# Patient Record
Sex: Male | Born: 1975
Health system: Southern US, Community
[De-identification: ages and names within clinical notes are randomized; demographics above are authoritative.]

## PROBLEM LIST (undated history)

## (undated) DIAGNOSIS — L4050 Arthropathic psoriasis, unspecified: Secondary | ICD-10-CM

## (undated) DIAGNOSIS — F419 Anxiety disorder, unspecified: Secondary | ICD-10-CM

## (undated) DIAGNOSIS — G4730 Sleep apnea, unspecified: Secondary | ICD-10-CM

## (undated) DIAGNOSIS — I10 Essential (primary) hypertension: Secondary | ICD-10-CM

## (undated) DIAGNOSIS — F32A Depression, unspecified: Secondary | ICD-10-CM

## (undated) DIAGNOSIS — T7840XA Allergy, unspecified, initial encounter: Secondary | ICD-10-CM

## (undated) DIAGNOSIS — F329 Major depressive disorder, single episode, unspecified: Secondary | ICD-10-CM

## (undated) HISTORY — DX: Depression, unspecified: F32.A

## (undated) HISTORY — PX: WISDOM TOOTH EXTRACTION: SHX21

## (undated) HISTORY — DX: Major depressive disorder, single episode, unspecified: F32.9

## (undated) HISTORY — DX: Anxiety disorder, unspecified: F41.9

## (undated) HISTORY — PX: VASECTOMY: SHX75

## (undated) HISTORY — DX: Allergy, unspecified, initial encounter: T78.40XA

## (undated) HISTORY — DX: Sleep apnea, unspecified: G47.30

## (undated) HISTORY — DX: Essential (primary) hypertension: I10

## (undated) HISTORY — DX: Arthropathic psoriasis, unspecified: L40.50

---

## 2002-05-17 HISTORY — PX: MOLE REMOVAL: SHX2046

## 2011-01-25 ENCOUNTER — Ambulatory Visit (INDEPENDENT_AMBULATORY_CARE_PROVIDER_SITE_OTHER): Payer: Managed Care, Other (non HMO) | Admitting: Family Medicine

## 2011-01-25 ENCOUNTER — Encounter: Payer: Self-pay | Admitting: Family Medicine

## 2011-01-25 DIAGNOSIS — Z8659 Personal history of other mental and behavioral disorders: Secondary | ICD-10-CM

## 2011-01-25 DIAGNOSIS — J45909 Unspecified asthma, uncomplicated: Secondary | ICD-10-CM

## 2011-01-25 DIAGNOSIS — K649 Unspecified hemorrhoids: Secondary | ICD-10-CM

## 2011-01-25 DIAGNOSIS — J4520 Mild intermittent asthma, uncomplicated: Secondary | ICD-10-CM | POA: Insufficient documentation

## 2011-01-25 MED ORDER — HYDROCORTISONE ACETATE 25 MG RE SUPP: 25.0000 mg | Freq: Two times a day (BID) | RECTAL | Status: AC

## 2011-01-25 NOTE — Patient Instructions (Signed)
Hemorrhoids Hemorrhoids are dilated (enlarged) veins around the rectum. Sometimes clots will form in the veins. This makes them swollen and painful. These are called thrombosed hemorrhoids. Causes of hemorrhoids include:  Pregnancy: this increases the pressure in the hemorrhoidal veins.   Constipation.   Straining to have a bowel movement.  HOME CARE INSTRUCTIONS  Eat a well balanced diet and drink 6 to 8 glasses of water every day to avoid constipation. You may also use a bulk laxative.   Avoid straining to have bowel movements.   Keep anal area dry and clean.   Only take over-the-counter or prescription medicines for pain, discomfort, or fever as directed by your caregiver.  If thrombosed:  Take hot sitz baths for 20 to 30 minutes, 3 to 4 times per day.   If the hemorrhoids are very tender and swollen, place ice packs on area as tolerated. Using ice packs between sitz baths may be helpful. Fill a plastic bag with ice and use a towel between the bag of ice and your skin.   Special creams and suppositories (Anusol, Nupercainal, Wyanoids) may be used or applied as directed.   Do not use a donut shaped pillow or sit on the toilet for long periods. This increases blood pooling and pain.   Move your bowels when your body has the urge; this will require less straining and will decrease pain and pressure.   Only take over-the-counter or prescription medicines for pain, discomfort, or fever as directed by your caregiver.  SEEK MEDICAL CARE IF:  You have increasing pain and swelling that is not controlled with your prescription.   You have uncontrolled bleeding.   You have an inability or difficulty having a bowel movement.   You have pain or inflammation outside the area of the hemorrhoids.   You have chills and/or an oral temperature above 100 that lasts for 2 days or longer, or as your caregiver suggests.  MAKE SURE YOU:   Understand these instructions.   Will watch your  condition.   Will get help right away if you are not doing well or get worse.  Document Released: 04/30/2000 Document Re-Released: 04/15/2008 Exit[employer] Patient Information 2011 [employer]. 

## 2011-01-25 NOTE — Progress Notes (Signed)
  Subjective:    Patient ID: Donald Pearson, male    DOB: 1976/02/19, 35 y.o.   MRN: [id number]  HPI New patient to establish care. Past medical history reviewed. Mild intermittent asthma. Uses albuterol inhaler infrequently. Past history of depression. Stopped antidepressant several months ago and stable. No prior surgeries. No current medications. Allergy to penicillin  Family history significant for mother and grandmother with type 2 diabetes. Grandparents with heart disease. Patient is married. 30-year-old daughter. Smokes one quarter pack cigarettes per day. One to 2 glasses of wine per day.  Acute issue of onset about 10 days ago bright red blood per rectum. No change in bowel habits. Occasional constipation. Bright blood. Noted with wiping. Symptoms now resolved for a few days. Similar episode 2 years ago and had anoscopy with internal hemorrhoids. Patient complains of some fatigue but no dizziness. He has scheduled lab later this week with his company for physical.   Review of Systems  Constitutional: Positive for fatigue. Negative for fever, chills, appetite change and unexpected weight change.  HENT: Negative for sore throat.   Respiratory: Negative for cough, shortness of breath and wheezing.   Cardiovascular: Negative for chest pain.  Gastrointestinal: Positive for blood in stool. Negative for nausea, vomiting, abdominal pain and diarrhea.  Genitourinary: Negative for dysuria.  Neurological: Negative for dizziness and headaches.  Hematological: Negative for adenopathy.       Objective:   Physical Exam  Constitutional: He appears well-developed and well-nourished.  HENT:  Right Ear: External ear normal.  Left Ear: External ear normal.  Mouth/Throat: Oropharynx is clear and moist.  Neck: Neck supple.  Cardiovascular: Normal rate, regular rhythm and normal heart sounds.   No murmur heard. Pulmonary/Chest: Effort normal and breath sounds normal. No respiratory distress. He  has no wheezes. He has no rales.  Abdominal: Soft. He exhibits no mass. There is no tenderness. There is no rebound and no guarding.  Genitourinary:       No external hemorrhoids. No anal fissure  Lymphadenopathy:    He has no cervical adenopathy.          Assessment & Plan:  Hematochezia. Most likely related to recurrent internal hemorrhoids. He has no bleeding past couple days. Measures to reduce constipation discussed. Hydrocortisone 25 mg suppositories twice daily. Followup promptly if any recurrent bleeding or change of bowel habits Asthma, mild intermittent. History depression.

## 2011-05-03 ENCOUNTER — Telehealth: Payer: Self-pay | Admitting: Internal Medicine

## 2011-05-03 MED ORDER — OSELTAMIVIR PHOSPHATE 75 MG PO CAPS: 75.0000 mg | ORAL_CAPSULE | Freq: Every day | ORAL | Status: AC

## 2011-05-03 NOTE — Telephone Encounter (Signed)
Wife recently diagnosed with influenza.  He has not been vaccinated.   I suggest tamiflu prophylaxis.

## 2011-05-12 ENCOUNTER — Ambulatory Visit: Payer: Managed Care, Other (non HMO) | Admitting: Family Medicine

## 2011-05-12 ENCOUNTER — Telehealth: Payer: Self-pay | Admitting: Family Medicine

## 2011-05-12 ENCOUNTER — Ambulatory Visit (INDEPENDENT_AMBULATORY_CARE_PROVIDER_SITE_OTHER): Payer: Managed Care, Other (non HMO) | Admitting: Family Medicine

## 2011-05-12 ENCOUNTER — Encounter: Payer: Self-pay | Admitting: Family Medicine

## 2011-05-12 VITALS — BP 120/84 | HR 90 | Temp 98.8°F | Wt 228.0 lb

## 2011-05-12 DIAGNOSIS — J0100 Acute maxillary sinusitis, unspecified: Secondary | ICD-10-CM

## 2011-05-12 MED ORDER — AZITHROMYCIN 250 MG PO TABS: 250.0000 mg | ORAL_TABLET | Freq: Every day | ORAL | Status: DC

## 2011-05-12 NOTE — Telephone Encounter (Signed)
Pt called to check on status of getting azithromycin (ZITHROMAX Z-PAK) 250 MG tablet sent resent to [facility] [address]

## 2011-05-12 NOTE — Telephone Encounter (Signed)
Please resubmit to [facility] - [place], [place] - [address]  azithromycin (ZITHROMAX Z-PAK) 250 MG tablet   Thank you!

## 2011-05-12 NOTE — Progress Notes (Signed)
  Subjective:    Patient ID: Donald Pearson, male    DOB: November 23, 1975, 35 y.o.   MRN: [id number]  HPI 35 year old white male, smoker, third of a pack of cigarettes per day, patient of Dr. Caryl Never is in with cough, chestcongestion, nasal congestion and one over four days and worsening. Cough is productive green sputum. Taken Afrin and Zyrtec-D and symptoms have not resolved.   Review of Systems  Constitutional: Positive for fatigue.  HENT: Positive for congestion, postnasal drip and sinus pressure.   Eyes: Negative.   Respiratory: Positive for cough.   Cardiovascular: Negative.   Gastrointestinal: Negative.   Genitourinary: Negative.   Musculoskeletal: Negative.   Skin: Negative.    Past Medical History  Diagnosis Date  . Asthma   . Depression     History   Social History  . Marital Status: Married    Spouse Name: N/A    Number of Children: N/A  . Years of Education: N/A   Occupational History  . Not on file.   Social History Main Topics  . Smoking status: Current Everyday Smoker -- 0.3 packs/day for 6 years    Types: Cigarettes  . Smokeless tobacco: Not on file  . Alcohol Use: Not on file  . Drug Use: Not on file  . Sexually Active: Not on file   Other Topics Concern  . Not on file   Social History Narrative  . No narrative on file    No past surgical history on file.  Family History  Problem Relation Age of Onset  . Diabetes Mother     type ll  . Heart disease Maternal Grandmother   . Diabetes Maternal Grandmother     type ll    Allergies  Allergen Reactions  . Penicillins     As a child, rash    Current Outpatient Prescriptions on File Prior to Visit  Medication Sig Dispense Refill  . oseltamivir (TAMIFLU) 75 MG capsule Take 1 capsule (75 mg total) by mouth daily.  10 capsule  0    BP 120/84  Pulse 90  Temp(Src) 98.8 F (37.1 C) (Oral)  Wt 228 lb (103.42 kg)chart    Objective:   Physical Exam  Constitutional: He is oriented to  person, place, and time. He appears well-developed and well-nourished.  HENT:  Head: Atraumatic.  Right Ear: External ear normal.  Eyes: Conjunctivae are normal. Pupils are equal, round, and reactive to light.  Neck: Normal range of motion. Neck supple.  Cardiovascular: Normal rate, regular rhythm and normal heart sounds.   Pulmonary/Chest: Effort normal and breath sounds normal.  Abdominal: Soft. Bowel sounds are normal.  Neurological: He is alert and oriented to person, place, and time.  Skin: Skin is warm and dry.          Assessment & Plan:  Assessment: Acute sinusitis  Plan: Z-Pak as directed. Continue Zyrtec-D. Advise to use Afrin 3 days on, 3 days off. Patient to call if symptoms worsen or persist recheck as scheduled and when necessary.

## 2011-05-12 NOTE — Telephone Encounter (Signed)
Rx called in to pharmacy. 

## 2011-05-12 NOTE — Patient Instructions (Signed)

## 2011-07-15 ENCOUNTER — Encounter: Payer: Self-pay | Admitting: Family Medicine

## 2011-07-15 ENCOUNTER — Ambulatory Visit (INDEPENDENT_AMBULATORY_CARE_PROVIDER_SITE_OTHER): Payer: Managed Care, Other (non HMO) | Admitting: Family Medicine

## 2011-07-15 VITALS — BP 130/80 | Temp 98.1°F | Wt 229.0 lb

## 2011-07-15 DIAGNOSIS — F4010 Social phobia, unspecified: Secondary | ICD-10-CM

## 2011-07-15 DIAGNOSIS — Z8659 Personal history of other mental and behavioral disorders: Secondary | ICD-10-CM

## 2011-07-15 DIAGNOSIS — J329 Chronic sinusitis, unspecified: Secondary | ICD-10-CM

## 2011-07-15 MED ORDER — AZITHROMYCIN 250 MG PO TABS: 250.0000 mg | ORAL_TABLET | Freq: Every day | ORAL | Status: DC

## 2011-07-15 MED ORDER — FLUOXETINE HCL 20 MG PO TABS: 20.0000 mg | ORAL_TABLET | Freq: Every day | ORAL | Status: DC

## 2011-07-15 NOTE — Patient Instructions (Signed)

## 2011-07-15 NOTE — Progress Notes (Signed)
  Subjective:    Patient ID: Donald Pearson, male    DOB: 05/23/1975, 36 y.o.   MRN: [id number]  HPI  Onset over one week of sinus symptoms. Facial pain and nasal congestion which is yellow appearance. Had some postnasal drainage. Moderate cough is nonproductive. Using Zyrtec-D without much relief. Intermittent headaches. Mild sore throat. Minimal body aches. Occasional nausea but no vomiting. He has tendency to get sinusitis a few times per year.  Patient relates prior history of depression and social anxiety disorder. Having more anxiety symptoms and depression this time. Has been previous on Prozac and requesting getting back on this. He tolerated well previously. He has anxiety in multiple settings but mostly in public. No history of panic disorder.  Past Medical History  Diagnosis Date  . Asthma   . Depression    No past surgical history on file.  reports that he has been smoking Cigarettes.  He has a 1.8 pack-year smoking history. He does not have any smokeless tobacco history on file. His alcohol and drug histories not on file. family history includes Diabetes in his maternal grandmother and mother and Heart disease in his maternal grandmother. Allergies  Allergen Reactions  . Penicillins     As a child, rash      Review of Systems  HENT: Positive for congestion and sinus pressure.   Respiratory: Positive for cough. Negative for shortness of breath.   Neurological: Positive for headaches.  Psychiatric/Behavioral: Positive for dysphoric mood. Negative for confusion and agitation. The patient is nervous/anxious.        Objective:   Physical Exam  Constitutional: He is oriented to person, place, and time. He appears well-developed and well-nourished.  HENT:  Right Ear: External ear normal.  Left Ear: External ear normal.  Mouth/Throat: Oropharynx is clear and moist.  Neck: Neck supple. No thyromegaly present.  Cardiovascular: Normal rate and regular rhythm.     Pulmonary/Chest: Effort normal and breath sounds normal. No respiratory distress. He has no wheezes. He has no rales.  Lymphadenopathy:    He has no cervical adenopathy.  Neurological: He is alert and oriented to person, place, and time.  Psychiatric: He has a normal mood and affect. His behavior is normal. Thought content normal.          Assessment & Plan:  #1 acute sinusitis. Probably viral. Recommend observation or now. Written prescription for Zithromax to fill only if he is not noticing improvement over the next few days or start sooner for any worsening #2 anxiety. Probable social anxiety disorder. Start back fluoxetine 20 mg by mouth daily

## 2011-07-30 ENCOUNTER — Ambulatory Visit (INDEPENDENT_AMBULATORY_CARE_PROVIDER_SITE_OTHER): Payer: Managed Care, Other (non HMO) | Admitting: Family Medicine

## 2011-07-30 ENCOUNTER — Encounter: Payer: Self-pay | Admitting: Family Medicine

## 2011-07-30 VITALS — BP 130/90 | Temp 98.1°F | Wt 229.0 lb

## 2011-07-30 DIAGNOSIS — J329 Chronic sinusitis, unspecified: Secondary | ICD-10-CM

## 2011-07-30 DIAGNOSIS — R1110 Vomiting, unspecified: Secondary | ICD-10-CM

## 2011-07-30 DIAGNOSIS — R5381 Other malaise: Secondary | ICD-10-CM

## 2011-07-30 DIAGNOSIS — R5383 Other fatigue: Secondary | ICD-10-CM

## 2011-07-30 MED ORDER — LEVOFLOXACIN 500 MG PO TABS: 500.0000 mg | ORAL_TABLET | Freq: Every day | ORAL | Status: AC

## 2011-07-30 NOTE — Progress Notes (Addendum)
Subjective:    Patient ID: Donald Pearson, male    DOB: 09/13/1975, 36 y.o.   MRN: [id number]  HPI  Patient seen for the following issues  Recurrent sinus problems. Has had about 3 weeks of sinus pressure mostly right maxillary sinus region. Was seen about 2 weeks ago and treated with Zithromax and did feel better briefly. He had several days of some progressive right maxillary pain. Intermittent headache. Fatigue. Occasional sore throat. Occasional bloody nasal discharge with yellow thick mucus. Using Afrin nasal without much improvement.  Patient had about 4-5 episodes of spontaneous vomiting over the past couple months. No clear pattern though possibly related to heavy fatty foods. He denies any abdominal pain. Denies any associated headache, fever, concomitant diarrhea, or any other clear precipitating factors. Denies any dysphagia. No concomitant headache. Only medication is fluoxetine. He has occasional alternating diarrhea and constipation but is usually relatively mild.  NO appetite or weight changes.  Past Medical History  Diagnosis Date  . Asthma   . Depression    No past surgical history on file.  reports that he has been smoking Cigarettes.  He has a 1.8 pack-year smoking history. He does not have any smokeless tobacco history on file. His alcohol and drug histories not on file. family history includes Diabetes in his maternal grandmother and mother and Heart disease in his maternal grandmother. Allergies  Allergen Reactions  . Penicillins     As a child, rash      Review of Systems  Constitutional: Positive for fatigue. Negative for fever, chills and appetite change.  HENT: Negative for hearing loss, ear pain, sore throat, trouble swallowing and neck stiffness.   Eyes: Negative for visual disturbance.  Respiratory: Negative for shortness of breath and wheezing.   Cardiovascular: Negative for chest pain and leg swelling.  Gastrointestinal: Positive for vomiting,  diarrhea and constipation. Negative for abdominal pain and blood in stool.  Neurological: Positive for headaches. Negative for dizziness, seizures, syncope and weakness.  Hematological: Negative for adenopathy.       Objective:   Physical Exam  Constitutional: He is oriented to person, place, and time. He appears well-developed and well-nourished. No distress.  HENT:  Right Ear: External ear normal.  Left Ear: External ear normal.       Swollen nasal mucosa bilaterally. Erythematous nasal mucosa. Yellow mucous right naris  Neck: Neck supple. No thyromegaly present.  Cardiovascular: Normal rate and regular rhythm.   Pulmonary/Chest: Effort normal and breath sounds normal. No respiratory distress. He has no wheezes. He has no rales.  Abdominal: Soft. Bowel sounds are normal. He exhibits no distension and no mass. There is no tenderness. There is no rebound and no guarding.  Musculoskeletal: He exhibits no edema.  Lymphadenopathy:    He has no cervical adenopathy.  Neurological: He is alert and oriented to person, place, and time. No cranial nerve deficit.  Skin: No rash noted.          Assessment & Plan:  #1 recurrent versus chronic maxillary sinusitis. Patient did improve briefly with initial antibiotic. Levaquin 500 milligrams once daily for 10 days. CT maxillofacial if not improved with above #2 recurrent vomiting over 2 months. Total of about 4 episodes. Doubt related to #1 as vomiting preceded above symptoms. No clear pattern or associated symptoms. Start with basic lab work. Consider abdominal ultrasound as symptoms may be correlated with high fat foods. Is not associated with any abdomnal pain.  Labs OK. Schedule abdominal ultrasound and if normal  and vomiting persist will need GI referral.

## 2011-07-31 LAB — CBC WITH DIFFERENTIAL/PLATELET
Basophils Relative: 1 % (ref 0–1)
Hemoglobin: 16.6 g/dL (ref 13.0–17.0)
MCHC: 35.5 g/dL (ref 30.0–36.0)
Monocytes Relative: 9 % (ref 3–12)
Neutro Abs: 5.4 10*3/uL (ref 1.7–7.7)
Neutrophils Relative %: 56 % (ref 43–77)
Platelets: 218 10*3/uL (ref 150–400)
RBC: 5.19 MIL/uL (ref 4.22–5.81)

## 2011-07-31 LAB — BASIC METABOLIC PANEL
Calcium: 10.3 mg/dL (ref 8.4–10.5)
Creat: 1.13 mg/dL (ref 0.50–1.35)

## 2011-07-31 LAB — LIPASE: Lipase: 32 U/L (ref 0–75)

## 2011-07-31 LAB — HEPATIC FUNCTION PANEL
AST: 28 U/L (ref 0–37)
Bilirubin, Direct: <0.1 mg/dL (ref 0.0–0.3)
Total Bilirubin: 0.7 mg/dL (ref 0.3–1.2)

## 2011-07-31 LAB — TSH: TSH: 1.127 u[IU]/mL (ref 0.350–4.500)

## 2011-08-01 NOTE — Progress Notes (Signed)
Addended by: Kristian Covey on: 08/01/2011 05:36 PM   Modules accepted: Orders

## 2011-08-05 ENCOUNTER — Other Ambulatory Visit: Payer: Managed Care, Other (non HMO)

## 2011-10-19 ENCOUNTER — Ambulatory Visit (INDEPENDENT_AMBULATORY_CARE_PROVIDER_SITE_OTHER): Payer: Managed Care, Other (non HMO) | Admitting: Family Medicine

## 2011-10-19 ENCOUNTER — Encounter: Payer: Self-pay | Admitting: Family Medicine

## 2011-10-19 VITALS — BP 120/70 | Temp 98.8°F | Wt 225.0 lb

## 2011-10-19 DIAGNOSIS — J019 Acute sinusitis, unspecified: Secondary | ICD-10-CM

## 2011-10-19 MED ORDER — AZITHROMYCIN 250 MG PO TABS: ORAL_TABLET | ORAL | Status: DC

## 2011-10-19 NOTE — Progress Notes (Signed)
  Subjective:    Patient ID: Donald Pearson, male    DOB: 1976-05-05, 36 y.o.   MRN: [id number]  HPI  Patient presents with one week history of progressive sinus issues. Has some seasonal allergies takes Zyrtec-D regularly. Intermittent headaches and bilateral maxillary facial pain. Yellowish nasal discharge. No fever. Cough is mostly dry. Increased malaise. He's also tried nasal saline irrigation without improvement.   Review of Systems  Constitutional: Negative for fever and chills.  HENT: Positive for congestion and sinus pressure.   Respiratory: Positive for cough. Negative for shortness of breath and wheezing.   Neurological: Positive for headaches.       Objective:   Physical Exam  Constitutional: He appears well-developed and well-nourished.  HENT:  Right Ear: External ear normal.  Left Ear: External ear normal.  Mouth/Throat: Oropharynx is clear and moist.       Slightly erythematous nasal mucosa bilaterally otherwise normal  Neck: Neck supple.  Cardiovascular: Normal rate and regular rhythm.   Pulmonary/Chest: Effort normal and breath sounds normal. No respiratory distress. He has no wheezes. He has no rales.  Lymphadenopathy:    He has no cervical adenopathy.          Assessment & Plan:   acute sinusitis. Continue saline nasal irrigation. Zithromax for 5 days. Continue Zyrtec-D

## 2012-05-29 ENCOUNTER — Ambulatory Visit (INDEPENDENT_AMBULATORY_CARE_PROVIDER_SITE_OTHER): Payer: Managed Care, Other (non HMO) | Admitting: Family Medicine

## 2012-05-29 ENCOUNTER — Encounter: Payer: Self-pay | Admitting: Family Medicine

## 2012-05-29 VITALS — BP 130/90 | Temp 99.5°F | Wt 239.0 lb

## 2012-05-29 DIAGNOSIS — J45909 Unspecified asthma, uncomplicated: Secondary | ICD-10-CM

## 2012-05-29 MED ORDER — BECLOMETHASONE DIPROPIONATE 80 MCG/ACT IN AERS: 2.0000 | INHALATION_SPRAY | Freq: Two times a day (BID) | RESPIRATORY_TRACT | Status: DC

## 2012-05-29 MED ORDER — ALBUTEROL SULFATE HFA 108 (90 BASE) MCG/ACT IN AERS: 2.0000 | INHALATION_SPRAY | RESPIRATORY_TRACT | Status: DC | PRN

## 2012-05-29 NOTE — Progress Notes (Signed)
  Subjective:    Patient ID: Donald Pearson, male    DOB: 12/24/1975, 37 y.o.   MRN: [id number]  HPI History of asthma. This has been generally mild intermittent. He got a new dog in October. Since mid December has been using his Ventolin inhaler almost daily. Frequent coughing and wheezing. Symptoms come and go. Takes Zyrtec intermittently. In the past has been on steroid inhaler but not for some time.    Review of Systems  Constitutional: Negative for fever and chills.  Respiratory: Positive for cough and wheezing.   Cardiovascular: Negative for chest pain.       Objective:   Physical Exam  Constitutional: He appears well-developed and well-nourished.  HENT:  Right Ear: External ear normal.  Left Ear: External ear normal.  Mouth/Throat: Oropharynx is clear and moist.  Cardiovascular: Normal rate and regular rhythm.   Pulmonary/Chest: Effort normal and breath sounds normal. No respiratory distress. He has no wheezes. He has no rales.          Assessment & Plan:  Asthma. This is becoming more persistent (moderate persistent). Start Qvar 80 2 puffs twice daily with samples given. Continue Ventolin as needed. Use Albuterol-as rescue inhaler. Instructions given for steroid inhaler. Flu vaccine offered and he will consider getting through work

## 2012-05-29 NOTE — Patient Instructions (Addendum)

## 2012-07-24 ENCOUNTER — Other Ambulatory Visit: Payer: Self-pay | Admitting: Family Medicine

## 2013-02-10 ENCOUNTER — Other Ambulatory Visit: Payer: Self-pay | Admitting: Family Medicine

## 2013-04-06 ENCOUNTER — Encounter: Payer: Self-pay | Admitting: Family Medicine

## 2013-04-06 ENCOUNTER — Ambulatory Visit (INDEPENDENT_AMBULATORY_CARE_PROVIDER_SITE_OTHER): Payer: Managed Care, Other (non HMO) | Admitting: Family Medicine

## 2013-04-06 VITALS — BP 128/68 | HR 105 | Temp 97.7°F | Wt 232.0 lb

## 2013-04-06 DIAGNOSIS — J069 Acute upper respiratory infection, unspecified: Secondary | ICD-10-CM

## 2013-04-06 MED ORDER — FLUTICASONE PROPIONATE 50 MCG/ACT NA SUSP: 2.0000 | Freq: Every day | NASAL | Status: DC

## 2013-04-06 NOTE — Patient Instructions (Signed)
Acute Bronchitis Bronchitis is inflammation of the airways that extend from the windpipe into the lungs (bronchi). The inflammation often causes mucus to develop. This leads to a cough, which is the most common symptom of bronchitis.  In acute bronchitis, the condition usually develops suddenly and goes away over time, usually in a couple weeks. Smoking, allergies, and asthma can make bronchitis worse. Repeated episodes of bronchitis may cause further lung problems.  CAUSES Acute bronchitis is most often caused by the same virus that causes a cold. The virus can spread from person to person (contagious).  SIGNS AND SYMPTOMS   Cough.   Fever.   Coughing up mucus.   Body aches.   Chest congestion.   Chills.   Shortness of breath.   Sore throat.  DIAGNOSIS  Acute bronchitis is usually diagnosed through a physical exam. Tests, such as chest X-rays, are sometimes done to rule out other conditions.  TREATMENT  Acute bronchitis usually goes away in a couple weeks. Often times, no medical treatment is necessary. Medicines are sometimes given for relief of fever or cough. Antibiotics are usually not needed but may be prescribed in certain situations. In some cases, an inhaler may be recommended to help reduce shortness of breath and control the cough. A cool mist vaporizer may also be used to help thin bronchial secretions and make it easier to clear the chest.  HOME CARE INSTRUCTIONS  Get plenty of rest.   Drink enough fluids to keep your urine clear or pale yellow (unless you have a medical condition that requires fluid restriction). Increasing fluids may help thin your secretions and will prevent dehydration.   Only take over-the-counter or prescription medicines as directed by your health care provider.   Avoid smoking and secondhand smoke. Exposure to cigarette smoke or irritating chemicals will make bronchitis worse. If you are a smoker, consider using nicotine gum or skin  patches to help control withdrawal symptoms. Quitting smoking will help your lungs heal faster.   Reduce the chances of another bout of acute bronchitis by washing your hands frequently, avoiding people with cold symptoms, and trying not to touch your hands to your mouth, nose, or eyes.   Follow up with your health care provider as directed.  SEEK MEDICAL CARE IF: Your symptoms do not improve after 1 week of treatment.  SEEK IMMEDIATE MEDICAL CARE IF:  You develop an increased fever or chills.   You have chest pain.   You have severe shortness of breath.  You have bloody sputum.   You develop dehydration.  You develop fainting.  You develop repeated vomiting.  You develop a severe headache. MAKE SURE YOU:   Understand these instructions.  Will watch your condition.  Will get help right away if you are not doing well or get worse. Document Released: 06/10/2004 Document Revised: 01/03/2013 Document Reviewed: 10/24/2012 [employer] Patient Information 2014 [employer].  

## 2013-04-06 NOTE — Progress Notes (Signed)
Pre visit review using our clinic review tool, if applicable. No additional management support is needed unless otherwise documented below in the visit note. 

## 2013-04-06 NOTE — Progress Notes (Signed)
  Subjective:    Patient ID: Donald Pearson, male    DOB: [birth year], 37 y.o.   MRN: [id number]  HPI  Acute visit Onset last Saturday of cough, nasal congestion, and sore throat. Denies any fever. Seemed to be getting better last Wednesday and yesterday felt worse again. Still no fever. Mostly has some postnasal drainage and cough. He prefers not to take any cough suppressants. Has tried over-the-counter Zyrtec-D. Nasal stuffiness especially worse in the night. Minimal headache.  Past Medical History  Diagnosis Date  . Asthma   . Depression    No past surgical history on file.  reports that he has been smoking Cigarettes.  He has a 1.8 pack-year smoking history. He does not have any smokeless tobacco history on file. His alcohol and drug histories are not on file. family history includes Diabetes in his maternal grandmother and mother; Heart disease in his maternal grandmother. Allergies  Allergen Reactions  . Penicillins     As a child, rash     Review of Systems  Constitutional: Negative for fever, chills and fatigue.  HENT: Positive for congestion and sore throat.   Respiratory: Positive for cough.        Objective:   Physical Exam  Constitutional: He appears well-developed and well-nourished.  HENT:  Right Ear: External ear normal.  Left Ear: External ear normal.  Mouth/Throat: Oropharynx is clear and moist.  Neck: Neck supple.  Cardiovascular: Normal rate and regular rhythm.   Pulmonary/Chest: Effort normal and breath sounds normal. No respiratory distress. He has no wheezes. He has no rales.  Lymphadenopathy:    He has no cervical adenopathy.          Assessment & Plan:  Viral URI with cough. Nonfocal exam. Flonase nasal 2 sprays per nostril once daily and continue Zyrtec-D. Followup promptly for fever or worsening symptoms

## 2013-06-11 ENCOUNTER — Other Ambulatory Visit: Payer: Self-pay | Admitting: Family Medicine

## 2013-08-02 ENCOUNTER — Ambulatory Visit: Payer: Managed Care, Other (non HMO) | Admitting: Family Medicine

## 2013-08-02 DIAGNOSIS — Z0289 Encounter for other administrative examinations: Secondary | ICD-10-CM

## 2013-08-07 ENCOUNTER — Other Ambulatory Visit: Payer: Self-pay

## 2013-08-07 ENCOUNTER — Telehealth: Payer: Self-pay | Admitting: Family Medicine

## 2013-08-07 MED ORDER — ALBUTEROL SULFATE HFA 108 (90 BASE) MCG/ACT IN AERS: 2.0000 | INHALATION_SPRAY | RESPIRATORY_TRACT | Status: DC | PRN

## 2013-08-07 NOTE — Telephone Encounter (Signed)
RX sent to pharmacy  

## 2013-08-07 NOTE — Telephone Encounter (Signed)
[facility] - [place], [place] - [address] is requesting re-fill on albuterol (PROVENTIL HFA;VENTOLIN HFA) 108 (90 BASE) MCG/ACT inhaler

## 2014-02-01 ENCOUNTER — Other Ambulatory Visit: Payer: Self-pay | Admitting: Family Medicine

## 2014-05-24 ENCOUNTER — Other Ambulatory Visit: Payer: Self-pay | Admitting: Family Medicine

## 2014-05-31 ENCOUNTER — Other Ambulatory Visit: Payer: Self-pay | Admitting: Family Medicine

## 2014-06-06 ENCOUNTER — Encounter: Payer: Self-pay | Admitting: Family Medicine

## 2014-06-06 ENCOUNTER — Ambulatory Visit (INDEPENDENT_AMBULATORY_CARE_PROVIDER_SITE_OTHER): Payer: Managed Care, Other (non HMO) | Admitting: Family Medicine

## 2014-06-06 VITALS — BP 130/76 | HR 110 | Temp 98.6°F | Wt 243.0 lb

## 2014-06-06 DIAGNOSIS — F4010 Social phobia, unspecified: Secondary | ICD-10-CM

## 2014-06-06 MED ORDER — FLUOXETINE HCL 20 MG PO TABS: 20.0000 mg | ORAL_TABLET | Freq: Every day | ORAL | Status: DC

## 2014-06-06 NOTE — Progress Notes (Signed)
Pre visit review using our clinic review tool, if applicable. No additional management support is needed unless otherwise documented below in the visit note. 

## 2014-06-06 NOTE — Progress Notes (Signed)
   Subjective:    Patient ID: Donald Pearson, male    DOB: [birth year], 39 y.o.   MRN: [id number]  HPI Patient seen for medical follow-up. He has history of mild intermittent asthma and rarely wheezes. He takes only as needed albuterol. He has history of social anxiety disorder has been on Prozac 20 mg daily for several years. Symptoms are mostly well controlled still has breakthrough symptoms occasionally. He's had depression the past which is stable. 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 Asthma   . Depression    No past surgical history on file.  reports that he has been smoking Cigarettes.  He has a 1.8 pack-year smoking history. He does not have any smokeless tobacco history on file. His alcohol and drug histories are not on file. family history includes Diabetes in his maternal grandmother and mother; Heart disease in his maternal grandmother. Allergies  Allergen Reactions  . Penicillins     As a child, rash      Review of Systems  Constitutional: Negative for appetite change and unexpected weight change.  Respiratory: Negative for cough and shortness of breath.   Cardiovascular: Negative for chest pain.  Psychiatric/Behavioral: Negative for suicidal ideas, sleep disturbance, dysphoric mood and agitation. The patient is nervous/anxious.        Objective:   Physical Exam  Constitutional: He appears well-developed and well-nourished.  Neck: Neck supple. No thyromegaly present.  Cardiovascular: Normal rate and regular rhythm.  Exam reveals no gallop.   No murmur heard. Pulmonary/Chest: Effort normal and breath sounds normal. No respiratory distress. He has no wheezes. He has no rales.  Musculoskeletal: He exhibits no edema.  Psychiatric: He has a normal mood and affect. His behavior is normal.          Assessment & Plan:  Social anxiety disorder. Currently stable. Refill Prozac for one year. We  discussed that we could titrate this medication up if he has any issues with poor control down the road. He will be in touch and let us know.

## 2014-06-10 ENCOUNTER — Ambulatory Visit: Payer: Managed Care, Other (non HMO) | Admitting: Family Medicine

## 2014-06-17 ENCOUNTER — Ambulatory Visit (INDEPENDENT_AMBULATORY_CARE_PROVIDER_SITE_OTHER): Payer: Managed Care, Other (non HMO) | Admitting: Family Medicine

## 2014-06-17 DIAGNOSIS — Z23 Encounter for immunization: Secondary | ICD-10-CM

## 2014-07-08 ENCOUNTER — Encounter: Payer: Self-pay | Admitting: Family Medicine

## 2014-07-08 ENCOUNTER — Ambulatory Visit (INDEPENDENT_AMBULATORY_CARE_PROVIDER_SITE_OTHER): Payer: Managed Care, Other (non HMO) | Admitting: Family Medicine

## 2014-07-08 DIAGNOSIS — R6889 Other general symptoms and signs: Secondary | ICD-10-CM

## 2014-07-08 DIAGNOSIS — J029 Acute pharyngitis, unspecified: Secondary | ICD-10-CM

## 2014-07-08 LAB — POCT RAPID STREP A (OFFICE): RAPID STREP A SCREEN: NEGATIVE

## 2014-07-08 LAB — POCT INFLUENZA A/B
INFLUENZA A, POC: NEGATIVE
Influenza B, POC: NEGATIVE

## 2014-07-08 MED ORDER — AZITHROMYCIN 250 MG PO TABS: ORAL_TABLET | ORAL | Status: AC

## 2014-07-08 MED ORDER — ALBUTEROL SULFATE HFA 108 (90 BASE) MCG/ACT IN AERS: 2.0000 | INHALATION_SPRAY | RESPIRATORY_TRACT | Status: DC | PRN

## 2014-07-08 NOTE — Progress Notes (Signed)
Pre visit review using our clinic review tool, if applicable. No additional management support is needed unless otherwise documented below in the visit note. 

## 2014-07-08 NOTE — Progress Notes (Signed)
   Subjective:    Patient ID: Donald Pearson, male    DOB: Sep 23, 1975, 39 y.o.   MRN: [id number]  HPI Acute visit. Patient states about 10 days ago he had typical cold-like symptoms. He seen to be improving and then this past weekend had relapse. He developed recurrent cough, nasal congestion, sore throat, and some body aches. Had some frontal and maxillary sinus pressure bilaterally. Headaches. Fever onset today with some chills. He had flu vaccine earlier this year. He has a newborn son at home. No obvious sick contacts. Denies any skin rash. No nausea or vomiting.  Past Medical History  Diagnosis Date  . Asthma   . Depression    No past surgical history on file.  reports that he has been smoking Cigarettes.  He has a 1.8 pack-year smoking history. He does not have any smokeless tobacco history on file. His alcohol and drug histories are not on file. family history includes Diabetes in his maternal grandmother and mother; Heart disease in his maternal grandmother. Allergies  Allergen Reactions  . Penicillins     As a child, rash      Review of Systems  Constitutional: Positive for fever, chills and fatigue.  HENT: Positive for congestion and sinus pressure.   Respiratory: Positive for cough.   Gastrointestinal: Negative for nausea and vomiting.  Neurological: Positive for headaches.       Objective:   Physical Exam  Constitutional: He appears well-developed and well-nourished. No distress.  HENT:  Right Ear: External ear normal.  Left Ear: External ear normal.  Mild erythema posterior pharynx but no exudate  Neck: Neck supple.  Cardiovascular: Normal rate and regular rhythm.   Pulmonary/Chest: Effort normal and breath sounds normal. No respiratory distress. He has no wheezes. He has no rales.  Lymphadenopathy:    He has no cervical adenopathy.  Skin: No rash noted.          Assessment & Plan:  Febrile illness. Suspect viral. Rule out influenza. Rapid strep  negative.  Influenza screen negative. Even though we suspect this started as a virus he has had relapse and with new onset fever and sinusitis symptoms cover with Zithromax. He is allergic to penicillin. We've highly advised that he take measures to reduce risk of contagiousness to his newborn son and if possible not to be around him for the next several days.

## 2015-06-27 ENCOUNTER — Other Ambulatory Visit: Payer: Self-pay | Admitting: Family Medicine

## 2015-09-04 ENCOUNTER — Other Ambulatory Visit: Payer: Self-pay | Admitting: Family Medicine

## 2015-10-03 ENCOUNTER — Other Ambulatory Visit: Payer: Self-pay | Admitting: Family Medicine

## 2016-03-25 ENCOUNTER — Other Ambulatory Visit: Payer: Self-pay | Admitting: Family Medicine

## 2016-03-25 MED ORDER — ALBUTEROL SULFATE HFA 108 (90 BASE) MCG/ACT IN AERS: 2.0000 | INHALATION_SPRAY | RESPIRATORY_TRACT | 0 refills | Status: DC | PRN

## 2016-03-25 NOTE — Telephone Encounter (Signed)
° ° °  Pt request refill of the following:  albuterol (PROVENTIL HFA;VENTOLIN HFA) 108 (90 BASE) MCG/ACT inhaler   [facility]  [facility] [address]

## 2016-03-25 NOTE — Telephone Encounter (Signed)
Rx done. 

## 2016-04-12 ENCOUNTER — Other Ambulatory Visit: Payer: Self-pay | Admitting: Family Medicine

## 2016-04-29 ENCOUNTER — Other Ambulatory Visit: Payer: Self-pay | Admitting: Family Medicine

## 2016-04-30 NOTE — Telephone Encounter (Signed)
Pt would  like to refill albuterol (PROVENTIL HFA;VENTOLIN HFA) 108 (90 Base) MCG/ACT inhaler  FLUoxetine (PROZAC) 20 MG tablet  [facility] [address]

## 2016-04-30 NOTE — Telephone Encounter (Signed)
Pt has been scheduled.  °

## 2016-04-30 NOTE — Telephone Encounter (Signed)
Pt needs a follow up appt--once appt scheduled will refill until then.

## 2016-04-30 NOTE — Telephone Encounter (Signed)
Left message to call back and schedule follow up appts,.

## 2016-05-03 ENCOUNTER — Ambulatory Visit (INDEPENDENT_AMBULATORY_CARE_PROVIDER_SITE_OTHER): Payer: Managed Care, Other (non HMO) | Admitting: Family Medicine

## 2016-05-03 ENCOUNTER — Encounter: Payer: Self-pay | Admitting: Family Medicine

## 2016-05-03 VITALS — BP 124/90 | HR 123 | Temp 98.3°F | Ht 68.25 in | Wt 227.0 lb

## 2016-05-03 DIAGNOSIS — J019 Acute sinusitis, unspecified: Secondary | ICD-10-CM

## 2016-05-03 DIAGNOSIS — Z716 Tobacco abuse counseling: Secondary | ICD-10-CM

## 2016-05-03 DIAGNOSIS — J4520 Mild intermittent asthma, uncomplicated: Secondary | ICD-10-CM

## 2016-05-03 DIAGNOSIS — Z720 Tobacco use: Secondary | ICD-10-CM

## 2016-05-03 DIAGNOSIS — F4010 Social phobia, unspecified: Secondary | ICD-10-CM | POA: Diagnosis not present

## 2016-05-03 MED ORDER — ALBUTEROL SULFATE HFA 108 (90 BASE) MCG/ACT IN AERS: 2.0000 | INHALATION_SPRAY | RESPIRATORY_TRACT | 0 refills | Status: DC | PRN

## 2016-05-03 MED ORDER — VARENICLINE TARTRATE 0.5 MG X 11 & 1 MG X 42 PO MISC: ORAL | 0 refills | Status: DC

## 2016-05-03 MED ORDER — DOXYCYCLINE HYCLATE 100 MG PO CAPS: 100.0000 mg | ORAL_CAPSULE | Freq: Two times a day (BID) | ORAL | 0 refills | Status: DC

## 2016-05-03 MED ORDER — VARENICLINE TARTRATE 1 MG PO TABS: 1.0000 mg | ORAL_TABLET | Freq: Two times a day (BID) | ORAL | 1 refills | Status: DC

## 2016-05-03 NOTE — Progress Notes (Signed)
Subjective:     Patient ID: Donald Pearson, male   DOB: 1975/06/09, 40 y.o.   MRN: [id number]  HPI Patient here to discuss multiple issues as follows  He relates sinusitis symptoms for almost 2 weeks. Started with somewhat typical cold-like symptoms but symptoms have worsened past few days. He has nasal congestion which is worse at night, cough, occasional headaches, yellowish nasal discharge, and maxillary facial pain. No fevers or chills. No relief with over-the-counter medications  History of mild intermittent asthma. Requesting refill of albuterol which he uses infrequently. Generally only wheezes with upper respiratory infections  Long history of social anxiety disorder. He takes Prozac 20 mg once daily and this seems to be controlling his anxiety symptoms very well. Decreased refills. He had some mild depression of the past but has been very stable  Ongoing nicotine use. Currently smokes less than 1 pack per day. He's tried over-the-counter gum without much success. He specifically has interest in trying Chantix. He denies any active depression symptoms. He has an almost 38-year-old son at home and this is increasing his motivation to quit  Past Medical History:  Diagnosis Date  . Asthma   . Depression    History reviewed. No pertinent surgical history.  reports that he has been smoking Cigarettes.  He has a 1.80 pack-year smoking history. He has never used smokeless tobacco. He reports that he drinks alcohol. He reports that he does not use drugs. family history includes Diabetes in his maternal grandmother and mother; Heart disease in his maternal grandmother. Allergies  Allergen Reactions  . Penicillins     As a child, rash     Review of Systems  Constitutional: Positive for fatigue. Negative for chills and fever.  HENT: Positive for congestion, sinus pain and sinus pressure. Negative for sore throat.   Respiratory: Positive for cough. Negative for shortness of breath.    Cardiovascular: Negative for chest pain.  Neurological: Positive for headaches.  Psychiatric/Behavioral: Negative for agitation, sleep disturbance and suicidal ideas.       Objective:   Physical Exam  Constitutional: He appears well-developed and well-nourished.  HENT:  Right Ear: External ear normal.  Left Ear: External ear normal.  Mouth/Throat: Oropharynx is clear and moist.  Neck: Neck supple.  Cardiovascular: Normal rate and regular rhythm.   Pulmonary/Chest: Effort normal and breath sounds normal. No respiratory distress. He has no wheezes. He has no rales.  Lymphadenopathy:    He has no cervical adenopathy.  Psychiatric: He has a normal mood and affect. His behavior is normal. Judgment and thought content normal.       Assessment:     #1 probable acute maxillary sinusitis  #2 social anxiety disorder stable on Prozac  #3 ongoing nicotine use with current high motivation to quit  #4 mild intermittent asthma    Plan:     -Doxycycline 100 mg twice daily for 10 days -Refill albuterol for as needed use -Continue Prozac 20 mg daily for social anxiety disorder -Long discussion regarding smoking cessation. We discussed pros and cons of using Chantix. Even though he has had some anxiety symptoms in the past he's never tried Chantix and we do not see any contraindications. Prescription for starter pack and maintenance pack given  Kristian CoveyBruce W Advith Martine MD [facility]

## 2016-05-03 NOTE — Progress Notes (Signed)
Pre visit review using our clinic review tool, if applicable. No additional management support is needed unless otherwise documented below in the visit note. 

## 2016-05-03 NOTE — Patient Instructions (Signed)
Steps to Quit Smoking Smoking tobacco can be harmful to your health and can affect almost every organ in your body. Smoking puts you, and those around you, at risk for developing many serious chronic diseases. Quitting smoking is difficult, but it is one of the best things that you can do for your health. It is never too late to quit. What are the benefits of quitting smoking? When you quit smoking, you lower your risk of developing serious diseases and conditions, such as:  Lung cancer or lung disease, such as COPD.  Heart disease.  Stroke.  Heart attack.  Infertility.  Osteoporosis and bone fractures.  Additionally, symptoms such as coughing, wheezing, and shortness of breath may get better when you quit. You may also find that you get sick less often because your body is stronger at fighting off colds and infections. If you are pregnant, quitting smoking can help to reduce your chances of having a baby of low birth weight. How do I get ready to quit? When you decide to quit smoking, create a plan to make sure that you are successful. Before you quit:  Pick a date to quit. Set a date within the next two weeks to give you time to prepare.  Write down the reasons why you are quitting. Keep this list in places where you will see it often, such as on your bathroom mirror or in your car or wallet.  Identify the people, places, things, and activities that make you want to smoke (triggers) and avoid them. Make sure to take these actions: ? Throw away all cigarettes at home, at work, and in your car. ? Throw away smoking accessories, such as ashtrays and lighters. ? Clean your car and make sure to empty the ashtray. ? Clean your home, including curtains and carpets.  Tell your family, friends, and coworkers that you are quitting. Support from your loved ones can make quitting easier.  Talk with your health care provider about your options for quitting smoking.  Find out what treatment  options are covered by your health insurance.  What strategies can I use to quit smoking? Talk with your healthcare provider about different strategies to quit smoking. Some strategies include:  Quitting smoking altogether instead of gradually lessening how much you smoke over a period of time. Research shows that quitting "cold turkey" is more successful than gradually quitting.  Attending in-person counseling to help you build problem-solving skills. You are more likely to have success in quitting if you attend several counseling sessions. Even short sessions of 10 minutes can be effective.  Finding resources and support systems that can help you to quit smoking and remain smoke-free after you quit. These resources are most helpful when you use them often. They can include: ? Online chats with a counselor. ? Telephone quitlines. ? Printed self-help materials. ? Support groups or group counseling. ? Text messaging programs. ? Mobile phone applications.  Taking medicines to help you quit smoking. (If you are pregnant or breastfeeding, talk with your health care provider first.) Some medicines contain nicotine and some do not. Both types of medicines help with cravings, but the medicines that include nicotine help to relieve withdrawal symptoms. Your health care provider may recommend: ? Nicotine patches, gum, or lozenges. ? Nicotine inhalers or sprays. ? Non-nicotine medicine that is taken by mouth.  Talk with your health care provider about combining strategies, such as taking medicines while you are also receiving in-person counseling. Using these two strategies together   makes you more likely to succeed in quitting than if you used either strategy on its own. If you are pregnant or breastfeeding, talk with your health care provider about finding counseling or other support strategies to quit smoking. Do not take medicine to help you quit smoking unless told to do so by your health care  provider. What things can I do to make it easier to quit? Quitting smoking might feel overwhelming at first, but there is a lot that you can do to make it easier. Take these important actions:  Reach out to your family and friends and ask that they support and encourage you during this time. Call telephone quitlines, reach out to support groups, or work with a counselor for support.  Ask people who smoke to avoid smoking around you.  Avoid places that trigger you to smoke, such as bars, parties, or smoke-break areas at work.  Spend time around people who do not smoke.  Lessen stress in your life, because stress can be a smoking trigger for some people. To lessen stress, try: ? Exercising regularly. ? Deep-breathing exercises. ? Yoga. ? Meditating. ? Performing a body scan. This involves closing your eyes, scanning your body from head to toe, and noticing which parts of your body are particularly tense. Purposefully relax the muscles in those areas.  Download or purchase mobile phone or tablet apps (applications) that can help you stick to your quit plan by providing reminders, tips, and encouragement. There are many free apps, such as QuitGuide from the CDC (Centers for Disease Control and Prevention). You can find other support for quitting smoking (smoking cessation) through smokefree.gov and other websites.  How will I feel when I quit smoking? Within the first 24 hours of quitting smoking, you may start to feel some withdrawal symptoms. These symptoms are usually most noticeable 2-3 days after quitting, but they usually do not last beyond 2-3 weeks. Changes or symptoms that you might experience include:  Mood swings.  Restlessness, anxiety, or irritation.  Difficulty concentrating.  Dizziness.  Strong cravings for sugary foods in addition to nicotine.  Mild weight gain.  Constipation.  Nausea.  Coughing or a sore throat.  Changes in how your medicines work in your  body.  A depressed mood.  Difficulty sleeping (insomnia).  After the first 2-3 weeks of quitting, you may start to notice more positive results, such as:  Improved sense of smell and taste.  Decreased coughing and sore throat.  Slower heart rate.  Lower blood pressure.  Clearer skin.  The ability to breathe more easily.  Fewer sick days.  Quitting smoking is very challenging for most people. Do not get discouraged if you are not successful the first time. Some people need to make many attempts to quit before they achieve long-term success. Do your best to stick to your quit plan, and talk with your health care provider if you have any questions or concerns. This information is not intended to replace advice given to you by your health care provider. Make sure you discuss any questions you have with your health care provider. Document Released: 04/27/2001 Document Revised: 12/30/2015 Document Reviewed: 09/17/2014 Elsevier Interactive Patient Education  2017 Elsevier Inc.  

## 2016-07-19 ENCOUNTER — Other Ambulatory Visit: Payer: Self-pay | Admitting: *Deleted

## 2016-07-19 ENCOUNTER — Telehealth: Payer: Self-pay | Admitting: Family Medicine

## 2016-07-19 MED ORDER — FLUOXETINE HCL 20 MG PO TABS: 20.0000 mg | ORAL_TABLET | Freq: Every day | ORAL | 0 refills | Status: DC

## 2016-07-19 NOTE — Telephone Encounter (Signed)
error 

## 2016-10-16 ENCOUNTER — Other Ambulatory Visit: Payer: Self-pay | Admitting: Family Medicine

## 2016-12-01 ENCOUNTER — Encounter: Payer: Self-pay | Admitting: Family Medicine

## 2016-12-01 ENCOUNTER — Ambulatory Visit (INDEPENDENT_AMBULATORY_CARE_PROVIDER_SITE_OTHER): Payer: Managed Care, Other (non HMO) | Admitting: Family Medicine

## 2016-12-01 VITALS — BP 130/78 | HR 105 | Temp 98.8°F | Ht 70.0 in | Wt 238.0 lb

## 2016-12-01 DIAGNOSIS — L400 Psoriasis vulgaris: Secondary | ICD-10-CM

## 2016-12-01 DIAGNOSIS — F339 Major depressive disorder, recurrent, unspecified: Secondary | ICD-10-CM | POA: Diagnosis not present

## 2016-12-01 DIAGNOSIS — R21 Rash and other nonspecific skin eruption: Secondary | ICD-10-CM

## 2016-12-01 MED ORDER — BETAMETHASONE DIPROPIONATE 0.05 % EX OINT: TOPICAL_OINTMENT | Freq: Two times a day (BID) | CUTANEOUS | 3 refills | Status: DC

## 2016-12-01 NOTE — Progress Notes (Signed)
Subjective:     Patient ID: Donald SmallingRyan Marsalis, male   DOB: 02-Sep-1975, 41 y.o.   MRN: [id number]  HPI Patient seen for several issues as follows  He describes a thick scaly well-demarcated rash on his left elbow and both knees. He had looked online and was concerned this may represent plaque psoriasis. No family history of psoriasis. Denies any arthralgias. He's tried occasional moisturizing creams. Denies any involvement of the buttock region or scalp region.  No exacerbating factors  Patient also has slightly pruritic rash involving the glans of the penis and the distal shaft. He is married. Wife has not had any symptoms. No history of balanitis. No history of diabetes. He's not tried thing topically. No dysuria.  Patient has long-standing history of anxiety with probable social anxiety. He also has history of depression. Is seeing psychologist regularly. They had suggested increasing his dose of Prozac. Currently on 20 mg daily. Denies any suicidal ideation.  Past Medical History:  Diagnosis Date  . Asthma   . Depression    No past surgical history on file.  reports that he has been smoking Cigarettes.  He has a 1.80 pack-year smoking history. He has never used smokeless tobacco. He reports that he drinks alcohol. He reports that he does not use drugs. family history includes Diabetes in his maternal grandmother and mother; Heart disease in his maternal grandmother. Allergies  Allergen Reactions  . Penicillins     As a child, rash     Review of Systems  Constitutional: Negative for appetite change and unexpected weight change.  Respiratory: Negative for shortness of breath.   Cardiovascular: Negative for chest pain.  Musculoskeletal: Negative for arthralgias.  Skin: Positive for rash.  Psychiatric/Behavioral: Positive for dysphoric mood. The patient is nervous/anxious.        Objective:   Physical Exam  Constitutional: He appears well-developed and well-nourished.   Cardiovascular: Normal rate and regular rhythm.   Pulmonary/Chest: Effort normal and breath sounds normal. No respiratory distress. He has no wheezes. He has no rales.  Skin: Rash noted.  Patient has thick well-demarcated plaque-like rash with silvery scale on both knees on the extensor surface. He has lesser involvement left elbow. Right elbow is clear. No scalp rash.  Patient has some very minimal erythema little whitish discharge distal penis and proximal glands on the dorsal aspect. 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 Suspect Candida. Doubt related to #1  #3 history of depression and anxiety    Plan:     -Betamethasone 0.05% ointment to use twice daily as needed but avoid continuous use-to areas of plaque psoriasis on the knees -Try over-the-counter Lotrimin AF cream for penile rash and touch base if not clearing in a couple weeks. -Get moist clothing off as quick as possible -Increase fluoxetine to 40 mg daily -Continue regular counseling -Touch base in 2-3 weeks for follow-up after increase of Prozac  Kristian CoveyBruce W Pressley Barsky MD [facility]

## 2016-12-01 NOTE — Patient Instructions (Signed)
Psoriasis Psoriasis is a long-term (chronic) condition of skin inflammation. It occurs because your immune system causes skin cells to form too quickly. As a result, too many skin cells grow and create raised, red patches (plaques) that look silvery on your skin. Plaques may appear anywhere on your body. They can be any size or shape. Psoriasis can come and go. The condition varies from mild to very severe. It cannot be passed from one person to another (not contagious). What are the causes? The cause of psoriasis is not known, but certain factors can make the condition worse. These include:  Damage or trauma to the skin, such as cuts, scrapes, sunburn, and dryness.  Lack of sunlight.  Certain medicines.  Alcohol.  Tobacco use.  Stress.  Infections caused by bacteria or viruses.  What increases the risk? This condition is more likely to develop in:  People with a family history of psoriasis.  People who are Caucasian.  People who are between the ages of 15-30 and 50-60 years old.  What are the signs or symptoms? There are five different types of psoriasis. You can have more than one type of psoriasis during your life. Types are:  Plaque.  Guttate.  Inverse.  Pustular.  Erythrodermic.  Each type of psoriasis has different symptoms.  Plaque psoriasis symptoms include red, raised plaques with a silvery white coating (scale). These plaques may be itchy. Your nails may be pitted and crumbly or fall off.  Guttate psoriasis symptoms include small red spots that often show up on your trunk, arms, and legs. These spots may develop after you have been sick, especially with strep throat.  Inverse psoriasis symptoms include plaques in your underarm area, under your breasts, or on your genitals, groin, or buttocks.  Pustular psoriasis symptoms include pus-filled bumps that are painful, red, and swollen on the palms of your hands or the soles of your feet. You also may feel  exhausted, feverish, weak, or have no appetite.  Erythrodermic psoriasis symptoms include bright red skin that may look burned. You may have a fast heartbeat and a body temperature that is too high or too low. You may be itchy or in pain.  How is this diagnosed? Your health care provider may suspect psoriasis based on your symptoms and family history. Your health care provider will also do a physical exam. This may include a procedure to remove a tissue sample (biopsy) for testing. You may also be referred to a health care provider who specializes in skin diseases (dermatologist). How is this treated? There is no cure for this condition, but treatment can help manage it. Goals of treatment include:  Helping your skin heal.  Reducing itching and inflammation.  Slowing the growth of new skin cells.  Helping your immune system respond better to your skin.  Treatment varies, depending on the severity of your condition. Treatment may include:  Creams or ointments.  Ultraviolet ray exposure (light therapy). This may include natural sunlight or light therapy in a medical office.  Medicines (systemic therapy). These medicines can help your body better manage skin cell turnover and inflammation. They may be used along with light therapy or ointments. You may also get antibiotic medicines if you have an infection.  Follow these instructions at home: Skin Care  Moisturize your skin as needed. Only use moisturizers that have been approved by your health care provider.  Apply cool compresses to the affected areas.  Do not scratch your skin. Lifestyle   Do not   use tobacco products. This includes cigarettes, chewing tobacco, and e-cigarettes. If you need help quitting, ask your health care provider.  Drink little or no alcohol.  Try techniques for stress reduction, such as meditation or yoga.  Get exposure to the sun as told by your health care provider. Do not get sunburned.  Consider  joining a psoriasis support group. Medicines  Take or use over-the-counter and prescription medicines only as told by your health care provider.  If you were prescribed an antibiotic, take or use it as told by your health care provider. Do not stop taking the antibiotic even if your condition starts to improve. General instructions  Keep a journal to help track what triggers an outbreak. Try to avoid any triggers.  See a counselor or social worker if feelings of sadness, frustration, and hopelessness about your condition are interfering with your work and relationships.  Keep all follow-up visits as told by your health care provider. This is important. Contact a health care provider if:  Your pain gets worse.  You have increasing redness or warmth in the affected areas.  You have new or worsening pain or stiffness in your joints.  Your nails start to break easily or pull away from the nail bed.  You have a fever.  You feel depressed. This information is not intended to replace advice given to you by your health care provider. Make sure you discuss any questions you have with your health care provider. Document Released: 04/30/2000 Document Revised: 10/09/2015 Document Reviewed: 09/18/2014 Elsevier Interactive Patient Education  2018 [employer]  Consider over the counter anti-fungal cream such as Lotrimin for penile rash.

## 2016-12-02 DIAGNOSIS — L400 Psoriasis vulgaris: Secondary | ICD-10-CM | POA: Insufficient documentation

## 2017-01-03 ENCOUNTER — Telehealth: Payer: Self-pay | Admitting: Family Medicine

## 2017-01-03 NOTE — Telephone Encounter (Signed)
12/01/16 last office visit -Touch base in 2-3 weeks for follow-up after increase of Prozac. No future appointments made. Okay to fill?

## 2017-01-03 NOTE — Telephone Encounter (Signed)
° ° ° °  Pt request refill of the following:  FLUoxetine (PROZAC) 40 MG capsule   [facility]: [facility]

## 2017-01-03 NOTE — Telephone Encounter (Signed)
If doing better, refill for 6 months.

## 2017-01-04 NOTE — Telephone Encounter (Signed)
Left message on machine for patient to return our call 

## 2017-01-05 MED ORDER — FLUOXETINE HCL 40 MG PO CAPS: 40.0000 mg | ORAL_CAPSULE | Freq: Every day | ORAL | 1 refills | Status: DC

## 2017-01-05 NOTE — Addendum Note (Signed)
Addended by: Kern Reap B on: 01/05/2017 11:52 AM   Modules accepted: Orders

## 2017-01-05 NOTE — Telephone Encounter (Signed)
Patient states he "is much better".  Rx sent and an appointment made.

## 2017-01-05 NOTE — Telephone Encounter (Signed)
Pt  Is returning rachel call

## 2017-01-13 ENCOUNTER — Other Ambulatory Visit: Payer: Self-pay | Admitting: Family Medicine

## 2017-07-04 ENCOUNTER — Ambulatory Visit: Payer: Self-pay | Admitting: Family Medicine

## 2017-07-04 ENCOUNTER — Encounter: Payer: Self-pay | Admitting: Family Medicine

## 2017-07-04 ENCOUNTER — Ambulatory Visit: Payer: Managed Care, Other (non HMO) | Admitting: Family Medicine

## 2017-07-04 VITALS — BP 120/84 | HR 97 | Temp 98.7°F | Wt 239.8 lb

## 2017-07-04 DIAGNOSIS — J0100 Acute maxillary sinusitis, unspecified: Secondary | ICD-10-CM | POA: Diagnosis not present

## 2017-07-04 DIAGNOSIS — F4010 Social phobia, unspecified: Secondary | ICD-10-CM

## 2017-07-04 DIAGNOSIS — J4520 Mild intermittent asthma, uncomplicated: Secondary | ICD-10-CM

## 2017-07-04 MED ORDER — ALBUTEROL SULFATE HFA 108 (90 BASE) MCG/ACT IN AERS: 2.0000 | INHALATION_SPRAY | RESPIRATORY_TRACT | 1 refills | Status: DC | PRN

## 2017-07-04 MED ORDER — DOXYCYCLINE HYCLATE 100 MG PO CAPS: 100.0000 mg | ORAL_CAPSULE | Freq: Two times a day (BID) | ORAL | 0 refills | Status: DC

## 2017-07-04 NOTE — Progress Notes (Signed)
Subjective:     Patient ID: Donald Pearson, male   DOB: [birth year], 42 y.o.   MRN: [id number]  HPI Patient seen for the following issues  History of mild intermittent asthma. Rarely has flareups. Uses albuterol as needed and requesting refills. He does have upper respiratory symptoms currently which started almost 2 weeks ago. Increased bilateral maxillary facial pain. Minimal drainage. Occasional headaches. No fever. Occasional sore throat.  History of social anxiety disorder.  Had been on Prozac in the past but felt like this was making him worse. He is currently getting counseling therapy and taken himself off medication couple months ago and feels better overall. Anxiety symptoms are stable. No recurrent depression  Past Medical History:  Diagnosis Date  . Asthma   . Depression    No past surgical history on file.  reports that he has been smoking cigarettes.  He has a 1.80 pack-year smoking history. he has never used smokeless tobacco. He reports that he drinks alcohol. He reports that he does not use drugs. family history includes Diabetes in his maternal grandmother and mother; Heart disease in his maternal grandmother. Allergies  Allergen Reactions  . Penicillins     As a child, rash     Review of Systems  Constitutional: Negative for fatigue.  HENT: Positive for congestion, sinus pressure and sinus pain.   Eyes: Negative for visual disturbance.  Respiratory: Positive for cough. Negative for chest tightness and shortness of breath.   Cardiovascular: Negative for chest pain, palpitations and leg swelling.  Neurological: Negative for dizziness, syncope, weakness, light-headedness and headaches.       Objective:   Physical Exam  Constitutional: He appears well-developed and well-nourished.  HENT:  Right Ear: External ear normal.  Left Ear: External ear normal.  Mouth/Throat: Oropharynx is clear and moist. No oropharyngeal exudate.  Neck: Neck supple.  Cardiovascular:  Normal rate and regular rhythm.  Pulmonary/Chest: Effort normal and breath sounds normal. No respiratory distress. He has no wheezes. He has no rales.  Lymphadenopathy:    He has no cervical adenopathy.       Assessment:     #1 probable acute bilateral maxillary sinusitis  #2 mild intermittent asthma  #3 history of social anxiety disorder currently stable off medication    Plan:     -Refill albuterol for as needed use -Doxycycline 100 mg twice daily for 10 days if sinusitis symptoms not improving over the next week or 2. Printed prescription was given -Follow-up for any worsening anxiety symptoms -Continue counseling regarding his chronic anxiety symptoms  Kristian CoveyBruce W Aquilla Voiles MD [facility]

## 2017-07-10 ENCOUNTER — Other Ambulatory Visit: Payer: Self-pay | Admitting: Family Medicine

## 2017-07-11 NOTE — Telephone Encounter (Signed)
Clarify if he is wanting to go back on this- or this an automatic pharmacy refill request?  Pt stated that he took himself off this recently.

## 2017-07-11 NOTE — Telephone Encounter (Signed)
Left message on machine for patient to return our call.  CRM created 

## 2017-07-11 NOTE — Telephone Encounter (Signed)
Last office visit 07/04/17: History of social anxiety disorder.  Had been on Prozac in the past but felt like this was making him worse. He is currently getting counseling therapy and taken himself off medication couple months ago and feels better overall. Anxiety symptoms are stable. No recurrent depression  Okay to fill?

## 2017-11-01 ENCOUNTER — Ambulatory Visit: Payer: Managed Care, Other (non HMO) | Admitting: Family Medicine

## 2017-11-01 ENCOUNTER — Ambulatory Visit: Payer: Self-pay

## 2017-11-01 ENCOUNTER — Encounter: Payer: Self-pay | Admitting: Family Medicine

## 2017-11-01 VITALS — BP 140/90 | HR 104 | Temp 98.8°F | Wt 236.4 lb

## 2017-11-01 DIAGNOSIS — R55 Syncope and collapse: Secondary | ICD-10-CM | POA: Diagnosis not present

## 2017-11-01 NOTE — Patient Instructions (Signed)
Vasovagal Syncope, Adult  Syncope, which is commonly known as fainting or passing out, is a temporary loss of consciousness. It occurs when the blood flow to the brain is reduced. Vasovagal syncope, also called neurocardiogenic syncope, is a fainting spell that happens when blood flow to the brain is reduced because of a sudden drop in heart rate and blood pressure.  Vasovagal syncope is usually harmless. However, you can get injured if you fall during a fainting spell.  What are the causes?  This condition is caused by a drop in heart rate and blood pressure, usually in response to a trigger. Many things and situations can trigger an episode, including:  · Pain.  · Fear.  · The sight of blood. This may occur during medical procedures, such as when blood is being drawn from a vein.  · Common activities, such as coughing, swallowing, stretching, or going to the bathroom.  · Emotional stress.  · Being in a confined space.  · Prolonged standing, especially in a warm environment.  · Lack of sleep or rest.  · Not eating for a long time.  · Not drinking enough liquids.  · Recent illness.  · Drinking alcohol.  · Taking drugs that affect blood pressure, such as marijuana, cocaine, opiates, or inhalants.    What are the signs or symptoms?  Before a fainting episode, you may:  · Feel dizzy or light-headed.  · Become pale.  · Sense that you are going to faint.  · Feel like the room is spinning.  · Only see directly ahead (tunnel vision).  · Feel sick to your stomach (nauseous).  · See spots.  · Slowly lose vision.  · Hear ringing in your ears.  · Have a headache.  · Feel warm and sweaty.  · Feel a sensation of pins and needles.    During the fainting spell, you may twitch or make jerky movements. Fainting spells usually last no longer than a few minutes before you wake up. If you get up too quickly before your body can recover, you may faint again.  How is this diagnosed?  This condition is diagnosed based on your symptoms,  your medical history, and a physical exam. Tests may be done to rule out other causes of fainting. Tests may include:  · Blood tests.  · Heart tests, such as an electrocardiogram (ECG), echocardiogram, or electrophysiology study.  · A test to check your response to changes in position (tilt table test).    How is this treated?  Usually, treatment is not needed for this condition. Your health care provider may suggest ways to help prevent fainting episodes. These may include:  · Drinking additional fluids if you are exposed to a trigger.  · Sitting or lying down if you notice signs that an episode is coming.    If your fainting spells continue, your health care provider may recommend that you:  · Take medicines to prevent fainting or to help reduce further episodes of fainting.  · Do certain exercises.  · Wear compression stockings.  · Have surgery to place a pacemaker in your body (rare).    Follow these instructions at home:  · Learn to identify the signs that an episode is coming.  · Sit or lie down at the first sign of a fainting spell. If you sit down, put your head down between your legs. If you lie down, swing your legs up in the air to increase blood flow to the brain.  ·   Avoid hot tubs and saunas.  · Avoid standing for a long time. If you have to stand for a long time, try:  ? Crossing your legs.  ? Flexing and stretching your leg muscles.  ? Squatting.  ? Moving your legs.  ? Bending over.  · Drink enough fluid to keep your urine clear or pale yellow.  · Make changes to your diet that your health care provider recommends. You may be told to:  ? Avoid caffeine.  ? Eat more salt.  · Take over-the-counter and prescription medicines only as told by your health care provider.  Contact a health care provider if:  · You continue to have fainting spells despite treatment.  · You faint more often despite treatment.  · You lose consciousness for more than a few minutes.  · You faint during or after exercising or  after being startled.  · You have twitching or jerky movements for longer than a few seconds during a fainting spell.  · You have an episode of twitching or jerky movements without fainting.  Get help right away if:  · A fainting spell leads to an injury or bleeding.  · You have new symptoms that occur with the fainting spells, such as:  ? Shortness of breath.  ? Chest pain.  ? Irregular heartbeat.  · You twitch or make jerky movements for more than 5 minutes.  · You twitch or make jerky movements during more than one fainting spell.  This information is not intended to replace advice given to you by your health care provider. Make sure you discuss any questions you have with your health care provider.  Document Released: 04/19/2012 Document Revised: 10/15/2015 Document Reviewed: 03/01/2015  Elsevier Interactive Patient Education © 2018 [employer]

## 2017-11-01 NOTE — Progress Notes (Signed)
  Subjective:     Patient ID: Donald Pearson, male   DOB: November 21, 1975, 42 y.o.   MRN: [id number]  HPI Patient seen following syncopal episode. He states last night he got up during the storm and noticed some nausea without vomiting. He had some sweats and went to the bathroom. He felt dizzy while urinating and then right after stopping he was on way back to the bedroom when he apparently had very brief syncopal episode. He states he was out for only a couple of seconds. He was aware of his surroundings immediately. He went back to bed and had no further dizziness or syncope since then. He denied any shortness of breath. No chest pain. No fever. No abdominal pain. No history of syncope.  Does have some nonspecific fatigue and muscle soreness today. Occasional headache. He does recall having a couple of mixed drinks with Gin prior to going to bed but this was not unusual for him.  He denies any recent diarrhea and had no persistent nausea. No history of seizure. His wife was with him and did not observe any seizure-like activity. He did not seem confused after the brief syncopal event  Past Medical History:  Diagnosis Date  . Asthma   . Depression    No past surgical history on file.  reports that he has been smoking cigarettes.  He has a 1.80 pack-year smoking history. He has never used smokeless tobacco. He reports that he drinks alcohol. He reports that he does not use drugs. family history includes Diabetes in his maternal grandmother and mother; Heart disease in his maternal grandmother. Allergies  Allergen Reactions  . Penicillins     As a child, rash     Review of Systems  Constitutional: Positive for fatigue. Negative for chills and fever.  Eyes: Negative for visual disturbance.  Respiratory: Negative for cough and shortness of breath.   Cardiovascular: Negative for chest pain.  Gastrointestinal: Negative for abdominal pain, blood in stool, diarrhea and vomiting.  Endocrine:  Negative for polydipsia and polyuria.  Genitourinary: Negative for dysuria.  Neurological: Positive for syncope. Negative for seizures, speech difficulty and light-headedness.  Hematological: Negative for adenopathy.  Psychiatric/Behavioral: Negative for confusion.       Objective:   Physical Exam  Constitutional: He is oriented to person, place, and time. He appears well-developed and well-nourished.  HENT:  Mouth/Throat: Oropharynx is clear and moist.  Neck: Neck supple. No thyromegaly present.  Cardiovascular: Normal rate and regular rhythm.  No murmur heard. Pulmonary/Chest: Effort normal and breath sounds normal.  Musculoskeletal: He exhibits no edema.  Neurological: He is alert and oriented to person, place, and time. No cranial nerve deficit. Coordination normal.       Assessment:     Syncopal episode last night. Given the setting of using the bathroom/urinating and with associated nausea suspect this waas vasovagal. He also had some alcohol previous to this which could be intruding factor. Doubt seizure activity    Plan:     -EKG shows sinus rhythm with no acute changes -Obtain labs including CBC and comprehensive metabolic panel and TSH -We discussed vasovagal syncope which seems most likely given the circumstances above -Follow-up immediately for any recurrent syncope or any other new symptoms. With certainly consider echocardiogram and possible event monitor for any recurrence  Kristian CoveyBruce W Shania Bjelland MD [facility]

## 2017-11-01 NOTE — Telephone Encounter (Signed)
Noted  

## 2017-11-01 NOTE — Telephone Encounter (Signed)
Pt. Reports he woke up in the middle of the night - was in a cold sweat and felt nauseated. Went to the restroom. When he was going back to bed, he fainted. Witnessed by wife. States he was out only a few seconds. No injuries, did not hit his head. Appointment made for today with his provider.  Reason for Disposition . Simple fainting is a chronic symptom (has occurred multiple times)  Answer Assessment - Initial Assessment Questions 1. ONSET: "How long were you unconscious?" (minutes) "When did it happen?"     A few seconds 2. CONTENT: "What happened during period of unconsciousness?" (e.g., seizure activity)      No 3. MENTAL STATUS: "Alert and oriented now?" (oriented x 3 = name, month, location)      Alert and oriented 4. TRIGGER: "What do you think caused the fainting?" "What were you doing just before you fainted?"  (e.g., exercise, sudden standing up, prolonged standing)     Felt nausea before 5. RECURRENT SYMPTOM: "Have you ever passed out before?" If so, ask: "When was the last time?" and "What happened that time?"      No 6. INJURY: "Did you sustain any injury during the fall?"      No 7. CARDIAC SYMPTOMS: "Have you had any of the following symptoms: chest pain, difficulty breathing, palpitations?"     No 8. NEUROLOGIC SYMPTOMS: "Have you had any of the following symptoms: headache, numbness, vertigo, weakness?"     Has a headache now 9. GI SYMPTOMS: "Have you had any of the following symptoms: abdominal pain, vomiting, diarrhea, blood in stools?"     No 10. OTHER SYMPTOMS: "Do you have any other symptoms?"       No 11. PREGNANCY: "Is there any chance you are pregnant?" "When was your last menstrual period?"       n/a  Protocols used: [facility]

## 2017-11-02 LAB — HEPATIC FUNCTION PANEL
ALK PHOS: 79 U/L (ref 39–117)
ALT: 36 U/L (ref 0–53)
AST: 21 U/L (ref 0–37)
Albumin: 4.8 g/dL (ref 3.5–5.2)
BILIRUBIN DIRECT: 0.1 mg/dL (ref 0.0–0.3)
BILIRUBIN TOTAL: 0.8 mg/dL (ref 0.2–1.2)
TOTAL PROTEIN: 7.2 g/dL (ref 6.0–8.3)

## 2017-11-02 LAB — BASIC METABOLIC PANEL
BUN: 18 mg/dL (ref 6–23)
CALCIUM: 10.1 mg/dL (ref 8.4–10.5)
CO2: 30 mEq/L (ref 19–32)
Chloride: 102 mEq/L (ref 96–112)
Creatinine, Ser: 1.01 mg/dL (ref 0.40–1.50)
GFR: 87.15 mL/min (ref >60.00)
Glucose, Bld: 93 mg/dL (ref 70–99)
Potassium: 4.3 mEq/L (ref 3.5–5.1)
SODIUM: 140 meq/L (ref 135–145)

## 2017-11-02 LAB — CBC WITH DIFFERENTIAL/PLATELET
BASOS ABS: 0.1 10*3/uL (ref 0.0–0.1)
Basophils Relative: 0.7 % (ref 0.0–3.0)
EOS ABS: 0.2 10*3/uL (ref 0.0–0.7)
Eosinophils Relative: 1.1 % (ref 0.0–5.0)
HEMATOCRIT: 45.9 % (ref 39.0–52.0)
Hemoglobin: 16.1 g/dL (ref 13.0–17.0)
LYMPHS PCT: 10 % — AB (ref 12.0–46.0)
Lymphs Abs: 1.7 10*3/uL (ref 0.7–4.0)
MCHC: 35.1 g/dL (ref 30.0–36.0)
MCV: 92.5 fl (ref 78.0–100.0)
MONOS PCT: 5.3 % (ref 3.0–12.0)
Monocytes Absolute: 0.9 10*3/uL (ref 0.1–1.0)
Neutro Abs: 14.5 10*3/uL — ABNORMAL HIGH (ref 1.4–7.7)
Neutrophils Relative %: 82.9 % — ABNORMAL HIGH (ref 43.0–77.0)
Platelets: 214 10*3/uL (ref 150.0–400.0)
RBC: 4.96 Mil/uL (ref 4.22–5.81)
RDW: 12.5 % (ref 11.5–15.5)
WBC: 17.4 10*3/uL — AB (ref 4.0–10.5)

## 2017-11-02 LAB — TSH: TSH: 1.19 u[IU]/mL (ref 0.35–4.50)

## 2017-11-03 ENCOUNTER — Telehealth: Payer: Self-pay | Admitting: Family Medicine

## 2017-11-03 ENCOUNTER — Other Ambulatory Visit: Payer: Self-pay

## 2017-11-03 DIAGNOSIS — D72829 Elevated white blood cell count, unspecified: Secondary | ICD-10-CM

## 2017-11-03 NOTE — Telephone Encounter (Signed)
Copied from CRM [id number]. Topic: Inquiry >> Nov 03, 2017 10:40 AM Tamela OddiMartin, Don'Quashia, NT wrote: Reason for CRM: Patient is calling and inquiring about his lab results. CB# [phone]

## 2017-11-03 NOTE — Telephone Encounter (Signed)
Called and spoke with pt this afternoon. Pt advised and voiced understanding.

## 2017-11-07 ENCOUNTER — Other Ambulatory Visit (INDEPENDENT_AMBULATORY_CARE_PROVIDER_SITE_OTHER): Payer: Managed Care, Other (non HMO)

## 2017-11-07 DIAGNOSIS — D72829 Elevated white blood cell count, unspecified: Secondary | ICD-10-CM | POA: Diagnosis not present

## 2017-11-07 LAB — CBC WITH DIFFERENTIAL/PLATELET
BASOS PCT: 0.8 % (ref 0.0–3.0)
Basophils Absolute: 0.1 10*3/uL (ref 0.0–0.1)
EOS ABS: 0.2 10*3/uL (ref 0.0–0.7)
Eosinophils Relative: 2.6 % (ref 0.0–5.0)
HCT: 44.5 % (ref 39.0–52.0)
HEMOGLOBIN: 15.8 g/dL (ref 13.0–17.0)
LYMPHS ABS: 2.2 10*3/uL (ref 0.7–4.0)
Lymphocytes Relative: 28.1 % (ref 12.0–46.0)
MCHC: 35.6 g/dL (ref 30.0–36.0)
MCV: 91.4 fl (ref 78.0–100.0)
Monocytes Absolute: 0.5 10*3/uL (ref 0.1–1.0)
Monocytes Relative: 6.6 % (ref 3.0–12.0)
NEUTROS PCT: 61.9 % (ref 43.0–77.0)
Neutro Abs: 4.9 10*3/uL (ref 1.4–7.7)
Platelets: 205 10*3/uL (ref 150.0–400.0)
RBC: 4.87 Mil/uL (ref 4.22–5.81)
RDW: 12.3 % (ref 11.5–15.5)
WBC: 7.9 10*3/uL (ref 4.0–10.5)

## 2017-11-08 ENCOUNTER — Telehealth: Payer: Self-pay | Admitting: *Deleted

## 2017-11-08 NOTE — Telephone Encounter (Signed)
Discussed results with patient, patient expressed understanding. Nothing further needed. ° °

## 2017-11-08 NOTE — Telephone Encounter (Signed)
Copied from CRM [id number]. Topic: Quick Communication - Lab Results >> Nov 08, 2017  2:19 PM Solon AugustaHedrick, Lindsay, CMA wrote: Called patient to inform them of 11/07/17 lab results. When patient returns call, triage nurse may disclose results. >> Nov 08, 2017  2:38 PM Tamela OddiHarris, Brenda J wrote: Patient called back to get his lab results.  Waiting for a call from nurse.  CB# [phone].

## 2018-01-04 ENCOUNTER — Ambulatory Visit: Payer: Managed Care, Other (non HMO) | Admitting: Family Medicine

## 2018-03-31 ENCOUNTER — Encounter: Payer: Self-pay | Admitting: Adult Health

## 2018-03-31 ENCOUNTER — Ambulatory Visit: Payer: Managed Care, Other (non HMO) | Admitting: Adult Health

## 2018-03-31 VITALS — Temp 98.1°F | Wt 237.0 lb

## 2018-03-31 DIAGNOSIS — L237 Allergic contact dermatitis due to plants, except food: Secondary | ICD-10-CM

## 2018-03-31 MED ORDER — PREDNISONE 20 MG PO TABS: ORAL_TABLET | ORAL | 0 refills | Status: DC

## 2018-03-31 MED ORDER — BETAMETHASONE DIPROPIONATE 0.05 % EX OINT: TOPICAL_OINTMENT | Freq: Two times a day (BID) | CUTANEOUS | 3 refills | Status: DC

## 2018-03-31 MED ORDER — METHYLPREDNISOLONE ACETATE 80 MG/ML IJ SUSP
80.0000 mg | Freq: Once | INTRAMUSCULAR | Status: AC
  Administered 2018-03-31: 80 mg via INTRAMUSCULAR

## 2018-03-31 NOTE — Patient Instructions (Signed)
It was great meeting you today   I have sent in a prescription for prednisone, take as directed ( 2 tabs x 7 days and then 1 tab x 7 days)   Keep area clean  You can take Benadryl and/or use oatmeal baths to help with itching.   Follow up with any signs of infection

## 2018-03-31 NOTE — Progress Notes (Signed)
Subjective:    Patient ID: Donald SmallingRyan Pearson, male    DOB: May 27, 1975, 42 y.o.   MRN: [id number]  HPI 42 year old male who  has a past medical history of Asthma and Depression.  Reports working in the woods last weekend and ended up getting entangled in poison ivy. Over the course of the week he noticed a red itchy rash appearing on bilateral forearms with vesicles forming over the last day or so. Reports intense itching. He has been using Calamine lotion which helps slightly.   Review of Systems See HPI   Past Medical History:  Diagnosis Date  . Asthma   . Depression     Social History   Socioeconomic History  . Marital status: Married    Spouse name: Not on file  . Number of children: Not on file  . Years of education: Not on file  . Highest education level: Not on file  Occupational History  . Not on file  Social Needs  . Financial resource strain: Not on file  . Food insecurity:    Worry: Not on file    Inability: Not on file  . Transportation needs:    Medical: Not on file    Non-medical: Not on file  Tobacco Use  . Smoking status: Current Every Day Smoker    Packs/day: 0.30    Years: 6.00    Pack years: 1.80    Types: Cigarettes  . Smokeless tobacco: Never Used  Substance and Sexual Activity  . Alcohol use: Yes  . Drug use: No  . Sexual activity: Not on file  Lifestyle  . Physical activity:    Days per week: Not on file    Minutes per session: Not on file  . Stress: Not on file  Relationships  . Social connections:    Talks on phone: Not on file    Gets together: Not on file    Attends religious service: Not on file    Active member of club or organization: Not on file    Attends meetings of clubs or organizations: Not on file    Relationship status: Not on file  . Intimate partner violence:    Fear of current or ex partner: Not on file    Emotionally abused: Not on file    Physically abused: Not on file    Forced sexual activity: Not on file    Other Topics Concern  . Not on file  Social History Narrative  . Not on file    No past surgical history on file.  Family History  Problem Relation Age of Onset  . Diabetes Mother        type ll  . Heart disease Maternal Grandmother   . Diabetes Maternal Grandmother        type ll    Allergies  Allergen Reactions  . Penicillins     As a child, rash    Current Outpatient Medications on File Prior to Visit  Medication Sig Dispense Refill  . albuterol (PROVENTIL HFA;VENTOLIN HFA) 108 (90 Base) MCG/ACT inhaler Inhale 2 puffs into the lungs every 4 (four) hours as needed for wheezing. 1 Inhaler 1  . cetirizine (ZYRTEC) 10 MG tablet Take 10 mg by mouth daily.     No current facility-administered medications on file prior to visit.     Temp 98.1 F (36.7 C)   Wt 237 lb (107.5 kg)   BMI 34.01 kg/m       Objective:  Physical Exam  Constitutional: He is oriented to person, place, and time. He appears well-developed and well-nourished. No distress.  Cardiovascular: Normal rate, regular rhythm, normal heart sounds and intact distal pulses.  Pulmonary/Chest: Effort normal and breath sounds normal.  Neurological: He is alert and oriented to person, place, and time.  Skin: Skin is warm and dry. Rash noted. Rash is vesicular. He is not diaphoretic.     Psychiatric: He has a normal mood and affect. His behavior is normal. Judgment and thought content normal.  Nursing note and vitals reviewed.     Assessment & Plan:  1. Poison ivy dermatitis - Will give depo medrol injection in the office and then prescribe two week course of prednisone to prevent rebound dermatitis. Advised Benadryl and oatmeal bathes.  - Follow up with any signs of infection  - predniSONE (DELTASONE) 20 MG tablet; Two tabs ( 40 mg) x 7 days then 1 tab( 20 mg) x 7 days  Dispense: 21 tablet; Refill: 0 - methylPREDNISolone acetate (DEPO-MEDROL) injection 80 mg   Shirline Frees, NP

## 2018-04-12 ENCOUNTER — Encounter: Payer: Self-pay | Admitting: Family Medicine

## 2018-04-26 ENCOUNTER — Encounter: Payer: Self-pay | Admitting: Family Medicine

## 2018-04-26 ENCOUNTER — Other Ambulatory Visit: Payer: Self-pay

## 2018-04-26 ENCOUNTER — Ambulatory Visit: Payer: Managed Care, Other (non HMO) | Admitting: Family Medicine

## 2018-04-26 VITALS — BP 120/86 | HR 91 | Temp 98.4°F | Wt 239.7 lb

## 2018-04-26 DIAGNOSIS — M25561 Pain in right knee: Secondary | ICD-10-CM

## 2018-04-26 DIAGNOSIS — M20012 Mallet finger of left finger(s): Secondary | ICD-10-CM

## 2018-04-26 NOTE — Progress Notes (Signed)
  Subjective:     Patient ID: Donald Pearson, male   DOB: 04-Sep-1975, 42 y.o.   MRN: [id number]  HPI  Patient seen for the following issues  Left middle finger injury.  This occurred the day before Thanksgiving around November 27.  He was trying to lift a couch cover and when grasping the cover apparently noticed some pain along his DIP joint left middle finger.  Was not aware of any hyperflexion injury.  He has had mallet type deformity since then.  This is the first day he sought out care.  He has some mild pain and swelling of the DIP joint.  Denied any bruising.  Second issue is some progressive knee pain right greater than left over the past year or so.  He does have history of psoriasis but has not had any psoriatic arthritis issues.  He notices occasional crepitus and has had some recent mild swelling.  No warmth or erythema.  No history of gout.  Stiffness noted in AM and after periods of sitting.  No dactylitis symptoms.  Past Medical History:  Diagnosis Date  . Asthma   . Depression    History reviewed. No pertinent surgical history.  reports that he has been smoking cigarettes. He has a 1.80 pack-year smoking history. He has never used smokeless tobacco. He reports that he drinks alcohol. He reports that he does not use drugs. family history includes Diabetes in his maternal grandmother and mother; Heart disease in his maternal grandmother. Allergies  Allergen Reactions  . Penicillins     As a child, rash    Review of Systems  Constitutional: Negative for chills and fever.  Respiratory: Negative for shortness of breath.   Cardiovascular: Negative for chest pain.  Musculoskeletal: Positive for arthralgias.  Skin: Positive for rash.  Neurological: Negative for weakness and numbness.       Objective:   Physical Exam  Constitutional: He appears well-developed and well-nourished.  Neck: Neck supple.  Cardiovascular: Normal rate and regular rhythm.  Pulmonary/Chest:  Effort normal and breath sounds normal.  Musculoskeletal:  Left middle finger reveals mallet deformity at the DIP joint.  He has some minimal swelling of the DIP joint and minimal tenderness.  No ecchymosis.  No warmth.  No erythema.  Patient has question of mild bilateral knee effusions.  No warmth or erythema.  No localized tenderness.  Full range of motion.  No crepitus.       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  This is something that occurred a few weeks ago.  #2 bilateral knee pain right greater than left.  Suspect he may have issues regarding psoriatic arthritis.  He has had fairly longstanding history of psoriasis rash    Plan:     -Discussed anatomy and physiology of mallet finger.  We explained that given delay in getting finger into extension that he has worse prognosis for full extension.  We gave him option of orthopedic referral and he declines at this time.  We did put him in an extension splint to leave on continuously and will bring back to reassess in about 4 weeks.  He will need several weeks of extension splinting to have any hopes of improvement  -Ordered x-ray right knee.  Depending on results consider rheumatology referral. Consider further labs- ESR, ANA, RF, CCP antibodies, etc pending x-rays.  Eulas Post MD [facility] at [facility]

## 2018-04-26 NOTE — Patient Instructions (Signed)
Mallet Finger  Mallet finger is an injury that occurs from a blow to the tip of your straightened finger or thumb. It is also known as baseball finger. The blow to your fingertip causes it to bend farther than normal, which tears the cord that attaches to the tip of your finger (extensor tendon). Your extensor tendon is what straightens the end of your finger. If this tendon is damaged, you will not be able to straighten your fingertip. Sometimes, a piece of bone may be pulled away with the tendon (avulsion injury), or the tendon may tear completely. In some cases, surgery may be required to repair the damage.  What are the causes?  Mallet finger is caused by a hard, direct hit to the tip of your finger or thumb. This injury often happens from getting hit in the finger with a hard ball, such as a baseball.  What increases the risk?  This injury is more likely to happen if you play sports that use a hard ball.  What are the signs or symptoms?  The main symptom of this injury is not being able to straighten the tip of your finger. You can manually straighten your fingertip with your other hand, but the finger cannot straighten on its own. Other symptoms may include:   Pain.   Swelling.   Bruising.   Blood under the fingernail.    How is this diagnosed?  Your health care provider may suspect mallet finger if you are not able to extend your fingertip, especially if you recently injured your hand. Your health care provider will do a physical exam. This may include X-rays to see if a piece of bone has been pulled away or if the finger joint has separated (dislocated).  How is this treated?  Mallet finger may be treated with:   Wearing a splint on your fingertip to keep it straight (extended) while the tendon heals.   Surgery to repair the tendon, in severe cases. This may involve:  ? The use of a pin or screw to keep your finger extended and your tendon attached.  ? Taking a piece of tendon from another part of your  body (graft) to replace a torn tendon.    Follow these instructions at home:   Take medicines only as directed by your health care provider.   Wear the splint as directed by your health care provider. Remove it only as directed by your health care provider.   If you take your splint off to dry it or change it, gently press your finger on a flat surface to keep it straight.   If directed, apply ice to the injured area:  ? Put ice in a plastic bag.  ? Place a towel between your skin and the bag.  ? Leave the ice on for 20 minutes, 2-3 times a day.   Raise the injured area above the level of your heart while you are sitting or lying down.  Contact a health care provider if:   You have pain or swelling that is getting worse.   Your finger feels cold.   You cannot extend your finger after treatment.  Get help right away if:   Even after loosening your splint, your finger is:  ? Very red and swollen.  ? White or blue.  ? Numb or tingling.  This information is not intended to replace advice given to you by your health care provider. Make sure you discuss any questions you have with   your health care provider.  Document Released: 04/30/2000 Document Revised: 10/09/2015 Document Reviewed: 03/06/2014  Elsevier Interactive Patient Education  2018 Elsevier Inc.

## 2018-04-27 ENCOUNTER — Ambulatory Visit (INDEPENDENT_AMBULATORY_CARE_PROVIDER_SITE_OTHER): Payer: Managed Care, Other (non HMO)

## 2018-04-27 DIAGNOSIS — M25561 Pain in right knee: Secondary | ICD-10-CM

## 2018-05-03 ENCOUNTER — Other Ambulatory Visit: Payer: Self-pay

## 2018-05-03 DIAGNOSIS — L400 Psoriasis vulgaris: Secondary | ICD-10-CM

## 2018-06-02 NOTE — Progress Notes (Signed)
Office Visit Note  Patient: Donald Pearson             Date of Birth: 02-13-1976           MRN: [id number]             PCP: Kristian CoveyBurchette, Bruce W, MD Referring: Kristian CoveyBurchette, Bruce W, MD Visit Date: 06/16/2018 Occupation: Dance movement psychotherapistnvironmental project manager  Subjective:  Pain in both knees.   History of Present Illness: Donald Pearson is a 43 y.o. male seen in consultation per request of his PCP.  According to patient he has had psoriasis for the last 2 years.  He has been seeing dermatologist and has been treated with topical agents.  He continues to have psoriasis on his knees and elbows.  He also has a scattered lesions on his upper and lower extremities.  He states about 1 year ago he started having right elbow joint pain which lasted for several months and resolved.  He had no injury.  In June 2019 he developed pain in his right knee joint and 2 months later in his left knee joint.  He states knees were very painful and swollen.  He did not go see a doctor at that time.  He has nocturnal pain and difficulty doing routine activities.  In December 2019 his symptoms improved and the swelling decreased.  He was seen by his PCP at that time.  The x-ray of the right knee joint was obtained which was within normal limits.  He continues to have stiffness in his bilateral knee joints.  He says stiffness lasts several hours in the morning.  He also gets very stiff after sitting in the car.  He also describes lower back pain over his SI joints for few years.  Family history of psoriasis and maternal uncle.  Activities of Daily Living:  Patient reports morning stiffness for several hours.   Patient Reports nocturnal pain.  Difficulty dressing/grooming: Denies Difficulty climbing stairs: Denies Difficulty getting out of chair: Denies Difficulty using hands for taps, buttons, cutlery, and/or writing: Denies  Review of Systems  Constitutional: Negative for fatigue.  HENT: Positive for nose dryness. Negative  for mouth sores and mouth dryness.   Eyes: Positive for dryness. Negative for itching.  Respiratory: Negative for shortness of breath and wheezing.   Cardiovascular: Negative for chest pain and hypertension.  Gastrointestinal: Negative for abdominal pain, constipation, diarrhea, nausea and vomiting.  Endocrine: Negative for increased urination.  Genitourinary: Negative for painful urination, pelvic pain and urgency.  Musculoskeletal: Positive for joint swelling and morning stiffness. Negative for arthralgias and joint pain.  Skin: Positive for rash. Negative for redness.  Allergic/Immunologic: Negative for susceptible to infections.  Neurological: Negative for dizziness, light-headedness, headaches, memory loss and weakness.  Hematological: Negative for bruising/bleeding tendency.  Psychiatric/Behavioral: Negative for confusion. The patient is not nervous/anxious.     PMFS History:  Patient Active Problem List   Diagnosis Date Noted  . Plaque psoriasis 12/02/2016  . Social anxiety disorder 07/15/2011  . History of depression 01/25/2011  . Asthma, mild intermittent 01/25/2011    Past Medical History:  Diagnosis Date  . Asthma   . Depression     Family History  Problem Relation Age of Onset  . Diabetes Mother        type ll  . Heart disease Maternal Grandmother   . Diabetes Maternal Grandmother        type ll  . Healthy Brother   . Healthy Son   .  Healthy Daughter    Past Surgical History:  Procedure Laterality Date  . MOLE REMOVAL  2004  . VASECTOMY    . WISDOM TOOTH EXTRACTION     Social History   Social History Narrative  . Not on file   Immunization History  Administered Date(s) Administered  . Influenza-Unspecified 03/17/2016, 03/08/2017  . Tdap 06/17/2014     Objective: Vital Signs: BP (!) 147/98 (BP Location: Right Arm, Patient Position: Sitting, Cuff Size: Normal)   Pulse 83   Resp 15   Ht 5\' 10"  (1.778 m)   Wt 238 lb (108 kg)   BMI 34.15 kg/m     Physical Exam Vitals signs and nursing note reviewed.  Constitutional:      Appearance: He is well-developed.  HENT:     Head: Normocephalic and atraumatic.  Eyes:     Conjunctiva/sclera: Conjunctivae normal.     Pupils: Pupils are equal, round, and reactive to light.  Neck:     Musculoskeletal: Normal range of motion and neck supple.  Cardiovascular:     Rate and Rhythm: Normal rate and regular rhythm.     Heart sounds: Normal heart sounds.  Pulmonary:     Effort: Pulmonary effort is normal.     Breath sounds: Normal breath sounds.  Abdominal:     General: Bowel sounds are normal.     Palpations: Abdomen is soft.  Skin:    General: Skin is warm and dry.     Capillary Refill: Capillary refill takes less than 2 seconds.     Findings: Rash present.     Comments: Lack psoriasis patches on bilateral knees, bilateral elbows and left hand  Neurological:     Mental Status: He is alert and oriented to person, place, and time.  Psychiatric:        Behavior: Behavior normal.      Musculoskeletal Exam: C-spine thoracic lumbar spine good range of motion.  He has tenderness on palpation of bilateral SI joints.  Shoulder joints, elbow joints, wrist joints, MCPs PIPs DIPs were in good range of motion with no synovitis.  Hip joints, knee joints, ankles MTPs PIPs and DIPs been good range of motion.  He has some warmth and effusion in bilateral knee joints.  CDAI Exam: CDAI Score: Not documented Patient Global Assessment: Not documented; Provider Global Assessment: Not documented Swollen: Not documented; Tender: Not documented Joint Exam   Not documented   There is currently no information documented on the homunculus. Go to the Rheumatology activity and complete the homunculus joint exam.  Investigation: No additional findings.  Imaging: Xr Knee 3 View Left  Result Date: 06/16/2018 No knee joint narrowing or patellofemoral narrowing was noted.  No chondrocalcinosis was noted.  Impression: Unremarkable x-ray of the knee joint.  Xr Pelvis 1-2 Views  Result Date: 06/16/2018 No SI joint to sclerosis or narrowing was noted. Impression: Unremarkable x-ray of the SI joints.   Recent Labs: Lab Results  Component Value Date   WBC 7.9 11/07/2017   HGB 15.8 11/07/2017   PLT 205.0 11/07/2017   NA 140 11/01/2017   K 4.3 11/01/2017   CL 102 11/01/2017   CO2 30 11/01/2017   GLUCOSE 93 11/01/2017   BUN 18 11/01/2017   CREATININE 1.01 11/01/2017   BILITOT 0.8 11/01/2017   ALKPHOS 79 11/01/2017   AST 21 11/01/2017   ALT 36 11/01/2017   PROT 7.2 11/01/2017   ALBUMIN 4.8 11/01/2017   CALCIUM 10.1 11/01/2017    Speciality Comments:  No specialty comments available.  Procedures:  No procedures performed Allergies: Penicillins   Assessment / Plan:     Visit Diagnoses: Plaque psoriasis-he has plaque psoriasis over extremities which has not responded well to topical agents.  Psoriatic arthritis-he gives history of morning stiffness lasting for several hours.  He had recent episode of right elbow joint pain and discomfort which lasted for long time.  Currently he has warmth and effusion in his bilateral knee joints.  He also has a stiffness lasting for several hours in multiple joints.  He complains of SI joint pain.  Chronic pain of both knees - Plan: XR KNEE 3 VIEW LEFT, x-ray of the knee joint was unremarkable.  I also reviewed right knee joint x-ray which was within normal limits.  Sedimentation rate, Uric acid, Rheumatoid factor, Cyclic citrul peptide antibody, IgG  Effusion of both knee joints-he has mild effusion in his bilateral knee joints and some warmth on palpation.  Chronic SI joint pain - Plan: XR Pelvis 1-2 Views.  The SI joint x-rays were unremarkable.  High risk medication use -I discussed different treatment options and their side effects.  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  We discussed  possible use of Otezla.  Indications side effects contraindications were discussed at length.  He is in agreement to proceed with [place].  We will apply for [facility].  We also discussed possible option of methotrexate he would like to hold off on that for right now.  Plan: Hepatitis panel, acute, HIV Antibody (routine testing w rflx), QuantiFERON-TB Gold Plus, Serum protein electrophoresis with reflex, IgG, IgA, IgM, CBC with Differential/Platelet, COMPLETE METABOLIC PANEL WITH GFR  Mild intermittent asthma without complication  History of depression  Social anxiety disorder  Smoker  Family history of psoriasis - maternal uncle   Orders: Orders Placed This Encounter  Procedures  . XR KNEE 3 VIEW LEFT  . XR Pelvis 1-2 Views  . Hepatitis panel, acute  . HIV Antibody (routine testing w rflx)  . QuantiFERON-TB Gold Plus  . Serum protein electrophoresis with reflex  . IgG, IgA, IgM  . CBC with Differential/Platelet  . COMPLETE METABOLIC PANEL WITH GFR  . Sedimentation rate  . Uric acid  . Rheumatoid factor  . Cyclic citrul peptide antibody, IgG   No orders of the defined types were placed in this encounter.   Face-to-face time spent with patient was 50 minutes. Greater than 50% of time was spent in counseling and coordination of care.  Follow-Up Instructions: Return for Psoriatic arthritis, psoriasis.   Pollyann Savoy, MD  Note - This record has been created using Animal nutritionist.  Chart creation errors have been sought, but may not always  have been located. Such creation errors do not reflect on  the standard of medical care.

## 2018-06-16 ENCOUNTER — Ambulatory Visit: Payer: Managed Care, Other (non HMO) | Admitting: Rheumatology

## 2018-06-16 ENCOUNTER — Telehealth: Payer: Self-pay | Admitting: Pharmacist

## 2018-06-16 ENCOUNTER — Ambulatory Visit (INDEPENDENT_AMBULATORY_CARE_PROVIDER_SITE_OTHER): Payer: Managed Care, Other (non HMO)

## 2018-06-16 ENCOUNTER — Encounter: Payer: Self-pay | Admitting: Rheumatology

## 2018-06-16 VITALS — BP 147/98 | HR 83 | Resp 15 | Ht 70.0 in | Wt 238.0 lb

## 2018-06-16 DIAGNOSIS — L400 Psoriasis vulgaris: Secondary | ICD-10-CM

## 2018-06-16 DIAGNOSIS — M25461 Effusion, right knee: Secondary | ICD-10-CM | POA: Diagnosis not present

## 2018-06-16 DIAGNOSIS — M25562 Pain in left knee: Secondary | ICD-10-CM | POA: Diagnosis not present

## 2018-06-16 DIAGNOSIS — Z840 Family history of diseases of the skin and subcutaneous tissue: Secondary | ICD-10-CM

## 2018-06-16 DIAGNOSIS — L4050 Arthropathic psoriasis, unspecified: Secondary | ICD-10-CM | POA: Diagnosis not present

## 2018-06-16 DIAGNOSIS — G8929 Other chronic pain: Secondary | ICD-10-CM | POA: Diagnosis not present

## 2018-06-16 DIAGNOSIS — M533 Sacrococcygeal disorders, not elsewhere classified: Secondary | ICD-10-CM | POA: Diagnosis not present

## 2018-06-16 DIAGNOSIS — J4520 Mild intermittent asthma, uncomplicated: Secondary | ICD-10-CM

## 2018-06-16 DIAGNOSIS — F4010 Social phobia, unspecified: Secondary | ICD-10-CM

## 2018-06-16 DIAGNOSIS — M25561 Pain in right knee: Secondary | ICD-10-CM | POA: Diagnosis not present

## 2018-06-16 DIAGNOSIS — M25462 Effusion, left knee: Secondary | ICD-10-CM

## 2018-06-16 DIAGNOSIS — Z8659 Personal history of other mental and behavioral disorders: Secondary | ICD-10-CM

## 2018-06-16 DIAGNOSIS — F17200 Nicotine dependence, unspecified, uncomplicated: Secondary | ICD-10-CM

## 2018-06-16 DIAGNOSIS — Z79899 Other long term (current) drug therapy: Secondary | ICD-10-CM

## 2018-06-16 NOTE — Telephone Encounter (Signed)
Received a Prior Authorization request  for [employer]. Authorization has been submitted to patient's insurance via Cover My Meds. Will update once we receive a response.  KeyLieutenant Diego - PA Case ID: [id number]

## 2018-06-16 NOTE — Telephone Encounter (Signed)
Patient dose will be Otezla starter titration pack and then 30 mg twice daily. He is naive to therapy except for topical agents.  Will apply through insurance. Once approved patient may pick up sample for starter pack from our office.

## 2018-06-16 NOTE — Patient Instructions (Signed)
Apremilast oral tablets  What is this medicine?  APREMILAST (a PRE mil ast) is used to treat plaque psoriasis, psoriatic arthritis, and certain oral ulcers.  This medicine may be used for other purposes; ask your health care provider or pharmacist if you have questions.  COMMON BRAND NAME(S): Otezla  What should I tell my health care provider before I take this medicine?  They need to know if you have any of these conditions:  -dehydration  -kidney disease  -mental illness  -an unusual or allergic reaction to apremilast, other medicines, foods, dyes, or preservatives  -pregnant or trying to get pregnant  -breast-feeding  How should I use this medicine?  Take this medicine by mouth with a glass of water. Follow the directions on the prescription label. Do not cut, crush or chew this medicine. You can take it with or without food. If it upsets your stomach, take it with food. Take your medicine at regular intervals. Do not take it more often than directed. Do not stop taking except on your doctor's advice.  Talk to your pediatrician regarding the use of this medicine in children. Special care may be needed.  Overdosage: If you think you have taken too much of this medicine contact a poison control center or emergency room at once.  NOTE: This medicine is only for you. Do not share this medicine with others.  What if I miss a dose?  If you miss a dose, take it as soon as you can. If it is almost time for your next dose, take only that dose. Do not take double or extra doses.  What may interact with this medicine?  This medicine may interact with the following medications:  -certain medicines for seizures like carbamazepine, phenobarbital, phenytoin  -rifampin  This list may not describe all possible interactions. Give your health care provider a list of all the medicines, herbs, non-prescription drugs, or dietary supplements you use. Also tell them if you smoke, drink alcohol, or use illegal drugs. Some items may  interact with your medicine.  What should I watch for while using this medicine?  Tell your doctor or healthcare professional if your symptoms do not start to get better or if they get worse.  Patients and their families should watch out for new or worsening depression or thoughts of suicide. Also watch out for sudden changes in feelings such as feeling anxious, agitated, panicky, irritable, hostile, aggressive, impulsive, severely restless, overly excited and hyperactive, or not being able to sleep. If this happens, call your health care professional.  Check with your doctor or health care professional if you get an attack of severe diarrhea, nausea and vomiting, or if you sweat a lot. The loss of too much body fluid can make it dangerous for you to take this medicine.  What side effects may I notice from receiving this medicine?  Side effects that you should report to your doctor or health care professional as soon as possible:  -depressed mood  -weight loss  Side effects that usually do not require medical attention (report to your doctor or health care professional if they continue or are bothersome):  -diarrhea  -headache  -nausea, vomiting  This list may not describe all possible side effects. Call your doctor for medical advice about side effects. You may report side effects to FDA at 1-800-FDA-1088.  Where should I keep my medicine?  Keep out of the reach of children.  Store below 30 degrees C (86 degrees F). Throw   away any unused medicine after the expiration date.  NOTE: This sheet is a summary. It may not cover all possible information. If you have questions about this medicine, talk to your doctor, pharmacist, or health care provider.   2019 Elsevier/Gold Standard (2017-12-05 12:47:14)

## 2018-06-16 NOTE — Progress Notes (Signed)
Pharmacy Note  Subjective:  Patient presents today to the [facility] to see Dr. Corliss Skains.  Patient was seen by the pharmacist for counseling on Otezla psoriatic arthritis and plaque psoriasis.  He is naive to therapy besides topical agents.  Objective: CMP     Component Value Date/Time   NA 140 11/01/2017 1651   K 4.3 11/01/2017 1651   CL 102 11/01/2017 1651   CO2 30 11/01/2017 1651   GLUCOSE 93 11/01/2017 1651   BUN 18 11/01/2017 1651   CREATININE 1.01 11/01/2017 1651   CREATININE 1.13 07/30/2011 1654   CALCIUM 10.1 11/01/2017 1651   PROT 7.2 11/01/2017 1651   ALBUMIN 4.8 11/01/2017 1651   AST 21 11/01/2017 1651   ALT 36 11/01/2017 1651   ALKPHOS 79 11/01/2017 1651   BILITOT 0.8 11/01/2017 1651    CBC    Component Value Date/Time   WBC 7.9 11/07/2017 0819   RBC 4.87 11/07/2017 0819   HGB 15.8 11/07/2017 0819   HCT 44.5 11/07/2017 0819   PLT 205.0 11/07/2017 0819   MCV 91.4 11/07/2017 0819   MCH 32.0 07/30/2011 1654   MCHC 35.6 11/07/2017 0819   RDW 12.3 11/07/2017 0819   LYMPHSABS 2.2 11/07/2017 0819   MONOABS 0.5 11/07/2017 0819   EOSABS 0.2 11/07/2017 0819   BASOSABS 0.1 11/07/2017 0819     Assessment/Plan:  Counseled patient that [place] is a PDE 4 inhibitor that works to treat psoriasis and the joint pain and tenderness of psoriatic arthritis.  Counseled patient on purpose, proper use, and adverse effects of Otezla.  Reviewed the most common adverse effects of weight loss, depression, nausea/diarrhea/vomiting, headaches, and nasal congestion.  Advised patient to notify office of any serious changes in mood and/or thoughts of suicide.  Provided patient with medication education material and answered all questions.  Patient consented to [place].  Will apply for Otezla through patient's insurance and update when we receive a response.  Patient dose will be Otezla starter titration pack and then 30 mg twice daily.  Prescription pending insurance approval  and once approved patient may pick up sample for starter pack from our office.  Patient verbalized understanding.  All questions encouraged and answered.  Instructed patient to call with any further questions or concerns.  Verlin Fester, PharmD, [facility] Rheumatology Clinical Pharmacist  06/16/2018 10:01 AM

## 2018-06-19 NOTE — Telephone Encounter (Signed)
Received a fax from [employer] regarding a prior authorization for [employer]. Authorization has been APPROVED from 06/19/2018 to 06/20/2019.   Will send document to scan center.  Authorization # [id number]  Left message notifying patient of approval and that he may come pick up a sample from our office.

## 2018-06-20 LAB — HEPATITIS PANEL, ACUTE
HEP B C IGM: NONREACTIVE
Hep A IgM: NONREACTIVE
Hepatitis B Surface Ag: NONREACTIVE
Hepatitis C Ab: NONREACTIVE
SIGNAL TO CUT-OFF: 0.01 (ref <1.00)

## 2018-06-20 LAB — COMPLETE METABOLIC PANEL WITH GFR
AG Ratio: 2 (calc) (ref 1.0–2.5)
ALT: 31 U/L (ref 9–46)
AST: 22 U/L (ref 10–40)
Albumin: 4.7 g/dL (ref 3.6–5.1)
Alkaline phosphatase (APISO): 68 U/L (ref 40–115)
BILIRUBIN TOTAL: 0.7 mg/dL (ref 0.2–1.2)
BUN: 15 mg/dL (ref 7–25)
CO2: 28 mmol/L (ref 20–32)
Calcium: 9.7 mg/dL (ref 8.6–10.3)
Chloride: 105 mmol/L (ref 98–110)
Creat: 1 mg/dL (ref 0.60–1.35)
GFR, Est African American: 109 mL/min/{1.73_m2} (ref >=60)
GFR, Est Non African American: 94 mL/min/{1.73_m2} (ref >=60)
Globulin: 2.3 g/dL (calc) (ref 1.9–3.7)
Glucose, Bld: 81 mg/dL (ref 65–99)
Potassium: 4.1 mmol/L (ref 3.5–5.3)
Sodium: 140 mmol/L (ref 135–146)
Total Protein: 7 g/dL (ref 6.1–8.1)

## 2018-06-20 LAB — CBC WITH DIFFERENTIAL/PLATELET
Absolute Monocytes: 624 cells/uL (ref 200–950)
Basophils Absolute: 77 cells/uL (ref 0–200)
Basophils Relative: 1 %
Eosinophils Absolute: 223 cells/uL (ref 15–500)
Eosinophils Relative: 2.9 %
HCT: 45.8 % (ref 38.5–50.0)
Hemoglobin: 16 g/dL (ref 13.2–17.1)
Lymphs Abs: 1648 cells/uL (ref 850–3900)
MCH: 32.3 pg (ref 27.0–33.0)
MCHC: 34.9 g/dL (ref 32.0–36.0)
MCV: 92.5 fL (ref 80.0–100.0)
MPV: 10.2 fL (ref 7.5–12.5)
Monocytes Relative: 8.1 %
Neutro Abs: 5128 cells/uL (ref 1500–7800)
Neutrophils Relative %: 66.6 %
PLATELETS: 194 10*3/uL (ref 140–400)
RBC: 4.95 10*6/uL (ref 4.20–5.80)
RDW: 11.8 % (ref 11.0–15.0)
Total Lymphocyte: 21.4 %
WBC: 7.7 10*3/uL (ref 3.8–10.8)

## 2018-06-20 LAB — IFE INTERPRETATION: Immunofix Electr Int: NOT DETECTED

## 2018-06-20 LAB — SEDIMENTATION RATE: Sed Rate: 2 mm/h (ref 0–15)

## 2018-06-20 LAB — QUANTIFERON-TB GOLD PLUS
Mitogen-NIL: 6.13 IU/mL
NIL: 0.1 IU/mL
QuantiFERON-TB Gold Plus: NEGATIVE
TB1-NIL: 0.01 IU/mL
TB2-NIL: 0 IU/mL

## 2018-06-20 LAB — PROTEIN ELECTROPHORESIS, SERUM, WITH REFLEX
ALPHA 2: 0.6 g/dL (ref 0.5–0.9)
Albumin ELP: 4.5 g/dL (ref 3.8–4.8)
Alpha 1: 0.3 g/dL (ref 0.2–0.3)
Beta 2: 0.4 g/dL (ref 0.2–0.5)
Beta Globulin: 0.5 g/dL (ref 0.4–0.6)
Gamma Globulin: 0.7 g/dL — ABNORMAL LOW (ref 0.8–1.7)
TOTAL PROTEIN: 7 g/dL (ref 6.1–8.1)

## 2018-06-20 LAB — IGG, IGA, IGM
IMMUNOGLOBULIN A: 281 mg/dL (ref 47–310)
IgG (Immunoglobin G), Serum: 734 mg/dL (ref 600–1640)
IgM, Serum: 46 mg/dL — ABNORMAL LOW (ref 50–300)

## 2018-06-20 LAB — RHEUMATOID FACTOR: Rheumatoid fact SerPl-aCnc: <14 IU/mL (ref <14)

## 2018-06-20 LAB — HIV ANTIBODY (ROUTINE TESTING W REFLEX): HIV: NONREACTIVE

## 2018-06-20 LAB — CYCLIC CITRUL PEPTIDE ANTIBODY, IGG

## 2018-06-20 LAB — URIC ACID: Uric Acid, Serum: 6.9 mg/dL (ref 4.0–8.0)

## 2018-06-23 ENCOUNTER — Ambulatory Visit: Payer: Managed Care, Other (non HMO) | Admitting: Adult Health

## 2018-06-23 ENCOUNTER — Encounter: Payer: Self-pay | Admitting: Adult Health

## 2018-06-23 VITALS — BP 136/88 | Temp 97.8°F | Wt 236.0 lb

## 2018-06-23 DIAGNOSIS — J0100 Acute maxillary sinusitis, unspecified: Secondary | ICD-10-CM

## 2018-06-23 DIAGNOSIS — J4520 Mild intermittent asthma, uncomplicated: Secondary | ICD-10-CM | POA: Diagnosis not present

## 2018-06-23 MED ORDER — FLUTICASONE PROPIONATE 50 MCG/ACT NA SUSP: 2.0000 | Freq: Every day | NASAL | 6 refills | Status: DC

## 2018-06-23 MED ORDER — ALBUTEROL SULFATE HFA 108 (90 BASE) MCG/ACT IN AERS: 2.0000 | INHALATION_SPRAY | RESPIRATORY_TRACT | 1 refills | Status: DC | PRN

## 2018-06-23 MED ORDER — DOXYCYCLINE HYCLATE 100 MG PO CAPS: 100.0000 mg | ORAL_CAPSULE | Freq: Two times a day (BID) | ORAL | 0 refills | Status: DC

## 2018-06-23 NOTE — Progress Notes (Signed)
Subjective:    Patient ID: Donald Pearson, male    DOB: 06-Jun-1975, 43 y.o.   MRN: [id number]  Sinusitis  This is a new problem. The current episode started in the past 7 days. The problem has been waxing and waning since onset. There has been no fever. Associated symptoms include chills, congestion, ear pain, headaches, sinus pressure and swollen glands. Pertinent negatives include no coughing, shortness of breath or sore throat.    Needs a refill of rescue inhaler   Review of Systems  Constitutional: Positive for activity change, chills and fatigue. Negative for appetite change and fever.  HENT: Positive for congestion, ear pain, rhinorrhea, sinus pressure and sinus pain. Negative for sore throat and trouble swallowing.   Respiratory: Negative for cough and shortness of breath.   Cardiovascular: Negative.   Musculoskeletal: Negative.   Neurological: Positive for headaches.  Psychiatric/Behavioral: Negative.    Past Medical History:  Diagnosis Date  . Asthma   . Depression     Social History   Socioeconomic History  . Marital status: Married    Spouse name: Not on file  . Number of children: Not on file  . Years of education: Not on file  . Highest education level: Not on file  Occupational History  . Not on file  Social Needs  . Financial resource strain: Not on file  . Food insecurity:    Worry: Not on file    Inability: Not on file  . Transportation needs:    Medical: Not on file    Non-medical: Not on file  Tobacco Use  . Smoking status: Current Every Day Smoker    Packs/day: 0.30    Years: 6.00    Pack years: 1.80    Types: Cigarettes  . Smokeless tobacco: Never Used  Substance and Sexual Activity  . Alcohol use: Yes    Comment: daily   . Drug use: No  . Sexual activity: Not on file  Lifestyle  . Physical activity:    Days per week: Not on file    Minutes per session: Not on file  . Stress: Not on file  Relationships  . Social connections:   Talks on phone: Not on file    Gets together: Not on file    Attends religious service: Not on file    Active member of club or organization: Not on file    Attends meetings of clubs or organizations: Not on file    Relationship status: Not on file  . Intimate partner violence:    Fear of current or ex partner: Not on file    Emotionally abused: Not on file    Physically abused: Not on file    Forced sexual activity: Not on file  Other Topics Concern  . Not on file  Social History Narrative  . Not on file    Past Surgical History:  Procedure Laterality Date  . MOLE REMOVAL  2004  . VASECTOMY    . WISDOM TOOTH EXTRACTION      Family History  Problem Relation Age of Onset  . Diabetes Mother        type ll  . Heart disease Maternal Grandmother   . Diabetes Maternal Grandmother        type ll  . Healthy Brother   . Healthy Son   . Healthy Daughter     Allergies  Allergen Reactions  . Penicillins     As a child, rash    Current Outpatient  Medications on File Prior to Visit  Medication Sig Dispense Refill  . betamethasone dipropionate (DIPROLENE) 0.05 % ointment Apply topically 2 (two) times daily. 45 g 3  . cetirizine (ZYRTEC) 10 MG tablet Take 10 mg by mouth daily.    . naproxen sodium (ALEVE) 220 MG tablet Take 220 mg by mouth.     No current facility-administered medications on file prior to visit.     BP 136/88   Temp 97.8 F (36.6 C)   Wt 236 lb (107 kg)   BMI 33.86 kg/m       Objective:   Physical Exam Vitals signs and nursing note reviewed.  Constitutional:      Appearance: He is normal weight. He is ill-appearing.  HENT:     Right Ear: Tympanic membrane, ear canal and external ear normal. There is no impacted cerumen.     Left Ear: Tympanic membrane, ear canal and external ear normal. There is no impacted cerumen.     Nose: Congestion present. No rhinorrhea.     Right Turbinates: Enlarged.     Left Turbinates: Enlarged.     Right Sinus:  Maxillary sinus tenderness present. No frontal sinus tenderness.     Left Sinus: Maxillary sinus tenderness present. No frontal sinus tenderness.     Mouth/Throat:     Mouth: Mucous membranes are moist.     Pharynx: Oropharynx is clear.  Cardiovascular:     Rate and Rhythm: Normal rate and regular rhythm.     Pulses: Normal pulses.     Heart sounds: Normal heart sounds.  Pulmonary:     Effort: Pulmonary effort is normal.     Breath sounds: Normal breath sounds.  Neurological:     Mental Status: He is alert.       Assessment & Plan:  1. Acute non-recurrent maxillary sinusitis - Exam consistent with sinusitis. Will treat due to duration and waxing and waning nature. Follow up if no improvement in the next 2-3 days  - doxycycline (VIBRAMYCIN) 100 MG capsule; Take 1 capsule (100 mg total) by mouth 2 (two) times daily.  Dispense: 14 capsule; Refill: 0 - albuterol (PROVENTIL HFA;VENTOLIN HFA) 108 (90 Base) MCG/ACT inhaler; Inhale 2 puffs into the lungs every 4 (four) hours as needed for wheezing.  Dispense: 1 Inhaler; Refill: 1 - fluticasone (FLONASE) 50 MCG/ACT nasal spray; Place 2 sprays into both nostrils daily.  Dispense: 16 g; Refill: 6  2. Mild intermittent asthma without complication  - fluticasone (FLONASE) 50 MCG/ACT nasal spray; Place 2 sprays into both nostrils daily.  Dispense: 16 g; Refill: 6  Shirline Frees, NP

## 2018-06-26 NOTE — Telephone Encounter (Signed)
Left another voicemail informing patient of approval.  Informed patient that he can stop by for a starter pack sample and that this medication must be filled at a specialty pharmacy.  He is eligible to fill at [facility] long outpatient pharmacy.

## 2018-06-27 MED ORDER — APREMILAST 30 MG PO TABS: 30.0000 mg | ORAL_TABLET | Freq: Two times a day (BID) | ORAL | 0 refills | Status: DC

## 2018-06-27 NOTE — Telephone Encounter (Addendum)
Patient returned call.  Informed him of Otezla approval.  Informed patient that this medication must be filled at a specialty pharmacy and he is eligible to fill at [facility] long.    Patient plans to stop by office this afternoon to pick up a sample starter pack and also schedule first fill with [facility]. Prescription sent to the pharmacy.  All questions encouraged and answered.  Instructed patient to call with any further questions or concerns.  Verlin Fester, PharmD, BCACP Rheumatology Clinical Pharmacist  06/27/2018 11:01 AM

## 2018-06-27 NOTE — Addendum Note (Signed)
Addended by: Verlin Fester C on: 06/27/2018 11:05 AM   Modules accepted: Orders

## 2018-06-28 DIAGNOSIS — M461 Sacroiliitis, not elsewhere classified: Secondary | ICD-10-CM | POA: Insufficient documentation

## 2018-06-28 DIAGNOSIS — L4050 Arthropathic psoriasis, unspecified: Secondary | ICD-10-CM | POA: Insufficient documentation

## 2018-06-28 DIAGNOSIS — Z840 Family history of diseases of the skin and subcutaneous tissue: Secondary | ICD-10-CM | POA: Insufficient documentation

## 2018-06-28 DIAGNOSIS — F17200 Nicotine dependence, unspecified, uncomplicated: Secondary | ICD-10-CM | POA: Insufficient documentation

## 2018-06-28 NOTE — Telephone Encounter (Signed)
Scheduled 1st shipment for Otezla to deliver to patient from [facility] on 2/19.  Medication Samples have been provided to the patient.  Drug name: Otezla starter pack       Strength: 10/20/30mg         Qty: 1  LOT: [id number]  Exp.Date: 01/2019  Dosing instructions: Follow package dosing instructions  The patient has been instructed regarding the correct time, dose, and frequency of taking this medication, including desired effects and most common side effects.   Donald Pearson 8:07 AM 06/28/2018

## 2018-06-28 NOTE — Progress Notes (Deleted)
Office Visit Note  Patient: Donald Pearson             Date of Birth: [birth year]           MRN: [id number]             PCP: Eulas Post, MD Referring: Eulas Post, MD Visit Date: 07/12/2018 Occupation: _0 @  Subjective:  No chief complaint on file.  Otezla 30 mg twice daily (started on 06/27/2018).  History of Present Illness: Donald Pearson is a 43 y.o. male ***   Activities of Daily Living:  Patient reports morning stiffness for *** {minute/hour:19697}.   Patient {ACTIONS;DENIES/REPORTS:21021675::"Denies"} nocturnal pain.  Difficulty dressing/grooming: {ACTIONS;DENIES/REPORTS:21021675::"Denies"} Difficulty climbing stairs: {ACTIONS;DENIES/REPORTS:21021675::"Denies"} Difficulty getting out of chair: {ACTIONS;DENIES/REPORTS:21021675::"Denies"} Difficulty using hands for taps, buttons, cutlery, and/or writing: {ACTIONS;DENIES/REPORTS:21021675::"Denies"}  No Rheumatology ROS completed.   PMFS History:  Patient Active Problem List   Diagnosis Date Noted  . Plaque psoriasis 12/02/2016  . Social anxiety disorder 07/15/2011  . History of depression 01/25/2011  . Asthma, mild intermittent 01/25/2011    Past Medical History:  Diagnosis Date  . Asthma   . Depression     Family History  Problem Relation Age of Onset  . Diabetes Mother        type ll  . Heart disease Maternal Grandmother   . Diabetes Maternal Grandmother        type ll  . Healthy Brother   . Healthy Son   . Healthy Daughter    Past Surgical History:  Procedure Laterality Date  . MOLE REMOVAL  2004  . VASECTOMY    . WISDOM TOOTH EXTRACTION     Social History   Social History Narrative  . Not on file   Immunization History  Administered Date(s) Administered  . Influenza-Unspecified 03/17/2016, 03/08/2017  . Tdap 06/17/2014     Objective: Vital Signs: There were no vitals taken for this visit.   Physical Exam   Musculoskeletal Exam: ***  CDAI Exam: CDAI Score: Not  documented Patient Global Assessment: Not documented; Provider Global Assessment: Not documented Swollen: Not documented; Tender: Not documented Joint Exam   Not documented   There is currently no information documented on the homunculus. Go to the Rheumatology activity and complete the homunculus joint exam.  Investigation: No additional findings.  Imaging: Xr Knee 3 View Left  Result Date: 06/16/2018 No knee joint narrowing or patellofemoral narrowing was noted.  No chondrocalcinosis was noted. Impression: Unremarkable x-ray of the knee joint.  Xr Pelvis 1-2 Views  Result Date: 06/16/2018 No SI joint to sclerosis or narrowing was noted. Impression: Unremarkable x-ray of the SI joints.   Recent Labs: Lab Results  Component Value Date   WBC 7.7 06/16/2018   HGB 16.0 06/16/2018   PLT 194 06/16/2018   NA 140 06/16/2018   K 4.1 06/16/2018   CL 105 06/16/2018   CO2 28 06/16/2018   GLUCOSE 81 06/16/2018   BUN 15 06/16/2018   CREATININE 1.00 06/16/2018   BILITOT 0.7 06/16/2018   ALKPHOS 79 11/01/2017   AST 22 06/16/2018   ALT 31 06/16/2018   PROT 7.0 06/16/2018   PROT 7.0 06/16/2018   ALBUMIN 4.8 11/01/2017   CALCIUM 9.7 06/16/2018   GFRAA 109 06/16/2018   QFTBGOLDPLUS NEGATIVE 06/16/2018  June 16, 2018 IFE negative, hepatitis ANA negative, hepatitis B-, hepatitis C negative, TB Gold negative, immunoglobulin IgM low at 46, HIV negative, ESR 2, uric acid 6.9, RF negative, anti-CCP negative  Speciality Comments: No specialty  comments available.  Procedures:  No procedures performed Allergies: Penicillins   Assessment / Plan:     Visit Diagnoses: No diagnosis found.   Orders: No orders of the defined types were placed in this encounter.  No orders of the defined types were placed in this encounter.   Face-to-face time spent with patient was *** minutes. Greater than 50% of time was spent in counseling and coordination of care.  Follow-Up Instructions: No  follow-ups on file.   Bo Merino, MD  Note - This record has been created using Editor, commissioning.  Chart creation errors have been sought, but may not always  have been located. Such creation errors do not reflect on  the standard of medical care.

## 2018-07-04 MED FILL — OTEZLA 30 MG TABS: 30 | 30 days supply | Qty: 60 | Fill #0

## 2018-07-12 ENCOUNTER — Ambulatory Visit: Payer: Self-pay | Admitting: Rheumatology

## 2018-07-13 NOTE — Progress Notes (Signed)
Office Visit Note  Patient: Donald Pearson             Date of Birth: Nov 22, 1975           MRN: [id number]             PCP: Eulas Post, MD Referring: Eulas Post, MD Visit Date: 07/14/2018 Occupation: _0 @  Subjective:  Psoriasis   History of Present Illness: Donald Pearson is a 43 y.o. male with history of psoriatic arthritis.  Patient has been taking Otezla 30 mg twice daily.  He has been on [place] for the past 3 weeks.  He denies any recent side effects.  He states that he did recently have a sinus infection but is off of antibiotics and denies any symptoms at this time.  He reports that last week he did have swelling in the left knee joint but it was very mild and resolved on its own.  He continues to have psoriasis on bilateral elbow joints and bilateral knee joints.  He has 1 patch of psoriasis on the left wrist.  He denies any other recent rashes.  He denies any joint pain or joint swelling at this time.  He continues to have morning stiffness lasting about 1 hour.  He denies any Achilles denies or plantar fasciitis.  He denies any SI joint pain at this time.   Activities of Daily Living:  Patient reports morning stiffness for 1 hour.   Patient Denies nocturnal pain.  Difficulty dressing/grooming: Denies Difficulty climbing stairs: Denies Difficulty getting out of chair: Denies Difficulty using hands for taps, buttons, cutlery, and/or writing: Denies  Review of Systems  Constitutional: Positive for fatigue.  HENT: Negative for mouth dryness.   Eyes: Negative for dryness.  Respiratory: Negative for shortness of breath.   Cardiovascular: Negative for swelling in legs/feet.  Gastrointestinal: Positive for constipation and diarrhea.  Endocrine: Negative for excessive thirst.  Genitourinary: Negative for difficulty urinating.  Musculoskeletal: Positive for morning stiffness.  Skin: Negative for rash.  Allergic/Immunologic: Negative for susceptible to  infections.  Neurological: Negative for weakness.  Hematological: Negative for bruising/bleeding tendency.  Psychiatric/Behavioral: Negative for sleep disturbance.    PMFS History:  Patient Active Problem List   Diagnosis Date Noted  . Psoriatic arthropathy ([place]) 06/28/2018  . Family history of psoriasis 06/28/2018  . Smoker 06/28/2018  . Sacroiliitis ([place]) 06/28/2018  . Plaque psoriasis 12/02/2016  . Social anxiety disorder 07/15/2011  . History of depression 01/25/2011  . Asthma, mild intermittent 01/25/2011    Past Medical History:  Diagnosis Date  . Asthma   . Depression     Family History  Problem Relation Age of Onset  . Diabetes Mother        type ll  . Heart disease Maternal Grandmother   . Diabetes Maternal Grandmother        type ll  . Healthy Brother   . Healthy Son   . Healthy Daughter    Past Surgical History:  Procedure Laterality Date  . MOLE REMOVAL  2004  . VASECTOMY    . WISDOM TOOTH EXTRACTION     Social History   Social History Narrative  . Not on file   Immunization History  Administered Date(s) Administered  . Influenza-Unspecified 03/17/2016, 03/08/2017  . Tdap 06/17/2014     Objective: Vital Signs: BP (!) 148/88 (BP Location: Left Arm, Patient Position: Sitting, Cuff Size: Normal)   Pulse 89   Resp 14   Ht _1  (1.778 m)  Wt 237 lb 9.6 oz (107.8 kg)   BMI 34.09 kg/m    Physical Exam Vitals signs and nursing note reviewed.  Constitutional:      Appearance: He is well-developed.  HENT:     Head: Normocephalic and atraumatic.  Eyes:     Conjunctiva/sclera: Conjunctivae normal.     Pupils: Pupils are equal, round, and reactive to light.  Neck:     Musculoskeletal: Normal range of motion and neck supple.  Cardiovascular:     Rate and Rhythm: Normal rate and regular rhythm.     Heart sounds: Normal heart sounds.  Pulmonary:     Effort: Pulmonary effort is normal.     Breath sounds: Normal breath sounds.  Abdominal:      General: Bowel sounds are normal.     Palpations: Abdomen is soft.  Lymphadenopathy:     Cervical: No cervical adenopathy.  Skin:    General: Skin is warm and dry.     Capillary Refill: Capillary refill takes less than 2 seconds.     Comments: Plaque psoriasis on extensor surfaces of bilateral elbow joints and bilateral knee joints.  He has a small patch of psoriasis on the dorsal aspect of the left wrist.  Neurological:     Mental Status: He is alert and oriented to person, place, and time.  Psychiatric:        Behavior: Behavior normal.      Musculoskeletal Exam: C-spine slightly limited range of motion with lateral rotation.  Good flexion extension.  Thoracic and lumbar spine good range of motion.  No midline spinal tenderness.  No SI joint tenderness.  Shoulder joints, elbow joints, wrist joints, MCPs, PIPs, DIPs good range of motion with no synovitis.  He has complete fist formation bilaterally.  Hip joints, knee joints, ankle joints, MTPs, PIPs, DIPs good range of motion no synovitis.  No warmth or effusion bilateral knee joints.  No tenderness or swelling of ankle joints.  No Achilles tenderness or plantar fasciitis.  No tenderness over trochanter bursa bilaterally.  CDAI Exam: CDAI Score: 0.2  Patient Global Assessment: 1 (mm); Provider Global Assessment: 1 (mm) Swollen: 0 ; Tender: 0  Joint Exam   Not documented   There is currently no information documented on the homunculus. Go to the Rheumatology activity and complete the homunculus joint exam.  Investigation: No additional findings.  Imaging: Xr Knee 3 View Left  Result Date: 06/16/2018 No knee joint narrowing or patellofemoral narrowing was noted.  No chondrocalcinosis was noted. Impression: Unremarkable x-ray of the knee joint.  Xr Pelvis 1-2 Views  Result Date: 06/16/2018 No SI joint to sclerosis or narrowing was noted. Impression: Unremarkable x-ray of the SI joints.   Recent Labs: Lab Results  Component  Value Date   WBC 7.7 06/16/2018   HGB 16.0 06/16/2018   PLT 194 06/16/2018   NA 140 06/16/2018   K 4.1 06/16/2018   CL 105 06/16/2018   CO2 28 06/16/2018   GLUCOSE 81 06/16/2018   BUN 15 06/16/2018   CREATININE 1.00 06/16/2018   BILITOT 0.7 06/16/2018   ALKPHOS 79 11/01/2017   AST 22 06/16/2018   ALT 31 06/16/2018   PROT 7.0 06/16/2018   PROT 7.0 06/16/2018   ALBUMIN 4.8 11/01/2017   CALCIUM 9.7 06/16/2018   GFRAA 109 06/16/2018   QFTBGOLDPLUS NEGATIVE 06/16/2018  IFE negative, hepatitis C negative, hepatitis B-, hepatitis C negative, TB Gold negative, immunoglobulin IgM low, HIV negative, ESR 2, uric acid 6.9, RF negative,  anti-CCP negative  Speciality Comments: No specialty comments available.  Procedures:  No procedures performed Allergies: Penicillins   Assessment / Plan:     Visit Diagnoses: Psoriatic arthropathy ([place]): He has no synovitis or dactylitis on exam.  He had an episode 1 week ago of left knee joint swelling but has resolved.  He started on Otezla 30 mg twice daily on 06/27/2018.  He has been tolerating it well without any side effects.  He has no joint pain or joint swelling at this time.  He continues to have morning stiffness lasting about 1 hour.  He has no SI joint tenderness on exam.  He has no Achilles tenderness or plantar fasciitis.  He continues to have plaque psoriasis on bilateral elbow joints and bilateral knee joints.  He has not noticed any improvement in starting [place].  He will continue on Otezla 30 mg twice daily.  He was advised to notify us if he develops any increased joint pain or joint swelling.  He will follow-up in the office in 3 months.  Effusion of both knee joints: No warmth or effusion noted.  He had an episode of left knee swelling 1 week ago that resolved on its own.  He has no discomfort in his knee joints at this time.  Sacroiliitis [facility]): He has no SI joint tenderness on exam.  Plaque psoriasis: He has plaque psoriasis on the  extensor surfaces of bilateral elbow joints and bilateral knee joints.  A small patch of psoriasis on the dorsal aspect of the left wrist.  High risk medication use: He started on Otezla 30 mg twice daily on 04/27/2019.  Other medical conditions are listed as follows:  Family history of psoriasis - Maternal uncle  Social anxiety disorder  Smoker  History of depression  History of asthma   Orders: No orders of the defined types were placed in this encounter.  No orders of the defined types were placed in this encounter.     Follow-Up Instructions: Return in about 3 months (around 10/12/2018) for Psoriatic arthritis.   Ofilia Neas, PA-C  Note - This record has been created using Dragon software.  Chart creation errors have been sought, but may not always  have been located. Such creation errors do not reflect on  the standard of medical care.

## 2018-07-14 ENCOUNTER — Encounter: Payer: Self-pay | Admitting: Rheumatology

## 2018-07-14 ENCOUNTER — Ambulatory Visit: Payer: Managed Care, Other (non HMO) | Admitting: Rheumatology

## 2018-07-14 VITALS — BP 148/88 | HR 89 | Resp 14 | Ht 70.0 in | Wt 237.6 lb

## 2018-07-14 DIAGNOSIS — Z840 Family history of diseases of the skin and subcutaneous tissue: Secondary | ICD-10-CM

## 2018-07-14 DIAGNOSIS — M461 Sacroiliitis, not elsewhere classified: Secondary | ICD-10-CM

## 2018-07-14 DIAGNOSIS — F4010 Social phobia, unspecified: Secondary | ICD-10-CM

## 2018-07-14 DIAGNOSIS — L400 Psoriasis vulgaris: Secondary | ICD-10-CM

## 2018-07-14 DIAGNOSIS — L4050 Arthropathic psoriasis, unspecified: Secondary | ICD-10-CM

## 2018-07-14 DIAGNOSIS — Z79899 Other long term (current) drug therapy: Secondary | ICD-10-CM

## 2018-07-14 DIAGNOSIS — Z8659 Personal history of other mental and behavioral disorders: Secondary | ICD-10-CM

## 2018-07-14 DIAGNOSIS — M25462 Effusion, left knee: Secondary | ICD-10-CM

## 2018-07-14 DIAGNOSIS — F17200 Nicotine dependence, unspecified, uncomplicated: Secondary | ICD-10-CM

## 2018-07-14 DIAGNOSIS — M25461 Effusion, right knee: Secondary | ICD-10-CM | POA: Diagnosis not present

## 2018-07-14 DIAGNOSIS — Z8709 Personal history of other diseases of the respiratory system: Secondary | ICD-10-CM

## 2018-08-02 MED FILL — OTEZLA 30 MG TABS: 30 | 30 days supply | Qty: 60 | Fill #1

## 2018-09-06 MED FILL — OTEZLA 30 MG TABS: 30 | 30 days supply | Qty: 60 | Fill #2

## 2018-09-28 ENCOUNTER — Telehealth: Payer: Self-pay | Admitting: Pharmacist

## 2018-09-28 NOTE — Telephone Encounter (Signed)
Thank you for informing me.  We will address this at his upcoming appointment.

## 2018-09-28 NOTE — Progress Notes (Signed)
Office Visit Note  Patient: Donald Pearson             Date of Birth: [birth year]           MRN: [id number]             PCP: Kristian Covey, MD Referring: Kristian Covey, MD Visit Date: 10/12/2018 Occupation: @GUAROCC @  Subjective:  Medication monitoring    History of Present Illness: Donald Pearson is a 43 y.o. male with history of psoriatic arthritis.  He is taking Otezla 30 mg BID.  He stated on [place] in February 2020.  He reports he has difficulty remembering to take the evening dose of Otezla.  He has been tolerating it well without any side effects.  He denies any joint pain or joint swelling.  He denies any morning stiffness.  He denies any SI joint pain, achilles tendonitis, or plantar fasciitis.  He reports his psoraisis has started to clear on both elbows and both knees.  He has not need to use topical agents.  He denies any recent knee joint effusions.  He has no concerns at this time.      Activities of Daily Living:  Patient reports morning stiffness for 0  minutes.   Patient Denies nocturnal pain.  Difficulty dressing/grooming: Denies Difficulty climbing stairs: Denies Difficulty getting out of chair: Denies Difficulty using hands for taps, buttons, cutlery, and/or writing: Denies  Review of Systems  Constitutional: Negative for fatigue and night sweats.  HENT: Negative for mouth sores, mouth dryness and nose dryness.   Eyes: Negative for photophobia, redness, visual disturbance and dryness.  Respiratory: Negative for cough, hemoptysis, shortness of breath and difficulty breathing.   Cardiovascular: Negative for chest pain, palpitations, hypertension, irregular heartbeat and swelling in legs/feet.  Gastrointestinal: Negative for blood in stool, constipation and diarrhea.  Endocrine: Negative for increased urination.  Genitourinary: Negative for painful urination.  Musculoskeletal: Negative for arthralgias, joint pain, joint swelling, myalgias, muscle  weakness, morning stiffness, muscle tenderness and myalgias.  Skin: Positive for rash. Negative for color change, hair loss, nodules/bumps, skin tightness, ulcers and sensitivity to sunlight.  Allergic/Immunologic: Negative for susceptible to infections.  Neurological: Negative for dizziness, fainting, memory loss, night sweats and weakness.  Hematological: Negative for swollen glands.  Psychiatric/Behavioral: Negative for depressed mood and sleep disturbance. The patient is not nervous/anxious.     PMFS History:  Patient Active Problem List   Diagnosis Date Noted  . Psoriatic arthropathy (HCC) 06/28/2018  . Family history of psoriasis 06/28/2018  . Smoker 06/28/2018  . Sacroiliitis (HCC) 06/28/2018  . Plaque psoriasis 12/02/2016  . Social anxiety disorder 07/15/2011  . History of depression 01/25/2011  . Asthma, mild intermittent 01/25/2011    Past Medical History:  Diagnosis Date  . Asthma   . Depression     Family History  Problem Relation Age of Onset  . Diabetes Mother        type ll  . Heart disease Maternal Grandmother   . Diabetes Maternal Grandmother        type ll  . Healthy Brother   . Healthy Son   . Healthy Daughter    Past Surgical History:  Procedure Laterality Date  . MOLE REMOVAL  2004  . VASECTOMY    . WISDOM TOOTH EXTRACTION     Social History   Social History Narrative  . Not on file   Immunization History  Administered Date(s) Administered  . Influenza-Unspecified 03/17/2016, 03/08/2017  . Tdap 06/17/2014  Objective: Vital Signs: BP (!) 148/95 (BP Location: Left Arm, Patient Position: Sitting, Cuff Size: Normal)   Pulse 96   Resp 14   Ht 5\' 10"  (1.778 m)   Wt 242 lb (109.8 kg)   BMI 34.72 kg/m    Physical Exam Vitals signs and nursing note reviewed.  Constitutional:      Appearance: He is well-developed.  HENT:     Head: Normocephalic and atraumatic.  Eyes:     Conjunctiva/sclera: Conjunctivae normal.     Pupils: Pupils are  equal, round, and reactive to light.  Neck:     Musculoskeletal: Normal range of motion and neck supple.  Cardiovascular:     Rate and Rhythm: Normal rate and regular rhythm.     Heart sounds: Normal heart sounds.  Pulmonary:     Effort: Pulmonary effort is normal.     Breath sounds: Normal breath sounds.  Abdominal:     General: Bowel sounds are normal.     Palpations: Abdomen is soft.  Lymphadenopathy:     Cervical: No cervical adenopathy.  Skin:    General: Skin is warm and dry.     Capillary Refill: Capillary refill takes less than 2 seconds.     Comments: Psoriasis patches on the extensor surface of both elbow joints and both knee joints No nail dystrophy noted.   Neurological:     Mental Status: He is alert and oriented to person, place, and time.  Psychiatric:        Behavior: Behavior normal.      Musculoskeletal Exam: C-spine, thoracic spine, and lumbar spine good ROM.  No midline spinal tenderness.  No SI joint tenderness.  Shoulder joints, elbow joints, wrist joints, MCPs, PIPs, and DIPs good ROM with no synovitis. Complete fist formation bilaterally.  Hip joints, knee joints, ankle joints, MTPs, PIPs, and DIPs good ROM with no synovitis.  No warmth or effusion of knee joints.  No tenderness or swelling of ankle joints.  No achilles tendonitis or plantar fasciitis.  No tenderness over trochanteric bursa bilaterally.    CDAI Exam: CDAI Score: Not documented Patient Global Assessment: Not documented; Provider Global Assessment: Not documented Swollen: Not documented; Tender: Not documented Joint Exam   Not documented   There is currently no information documented on the homunculus. Go to the Rheumatology activity and complete the homunculus joint exam.  Investigation: No additional findings.  Imaging: No results found.  Recent Labs: Lab Results  Component Value Date   WBC 7.7 06/16/2018   HGB 16.0 06/16/2018   PLT 194 06/16/2018   NA 140 06/16/2018   K 4.1  06/16/2018   CL 105 06/16/2018   CO2 28 06/16/2018   GLUCOSE 81 06/16/2018   BUN 15 06/16/2018   CREATININE 1.00 06/16/2018   BILITOT 0.7 06/16/2018   ALKPHOS 79 11/01/2017   AST 22 06/16/2018   ALT 31 06/16/2018   PROT 7.0 06/16/2018   PROT 7.0 06/16/2018   ALBUMIN 4.8 11/01/2017   CALCIUM 9.7 06/16/2018   GFRAA 109 06/16/2018   QFTBGOLDPLUS NEGATIVE 06/16/2018    Speciality Comments: No specialty comments available.  Procedures:  No procedures performed Allergies: Penicillins   Assessment / Plan:     Visit Diagnoses: Psoriatic arthropathy (HCC): He has no synovitis or dactylitis on exam.  He has not had any recent psoriatic arthritis flares.  He is clinically doing well on Otezla 30 mg twice daily.  He often misses the evening dose, so we discussed strategies to increased  compliance.  He has no joint pain, morning stiffness, or joint swelling at this time.  He has no Achilles tendinitis or plantar fasciitis.  No SI joint tenderness was noted on exam.  His psoriasis has started to clear but he still has a few small patches on the extensor surfaces of bilateral elbow joints and bilateral knee joints.  He has not needed to use topical agents recently.  He will continue on Otezla 30 mg twice daily.  He does not need any refills at this time.  He is advised to notify us he does increase joint pain or joint swelling.  He will follow-up in the office in 6 months.  Plaque psoriasis: He has a few patches of plaque psoriasis on the extensor surfaces of bilateral elbow joints and bilateral knee joints.  His psoriasis started to clear since starting on Otezla in February 2020.  He has not needed to use any topical agents.  No nail dystrophy or nail pitting was noted.  High risk medication use -  He started on Otezla 30 mg twice daily in February 2020.   Effusion of both knee joints: Resolved.  He has no warmth or effusion.  He has good range of motion of bilateral knee joints with no discomfort.   Sacroiliitis [facility]): He has no SI joint tenderness on exam.  Other medical conditions are listed as follows:  Family history of psoriasis  Social anxiety disorder  Smoker  History of depression  History of asthma   Orders: No orders of the defined types were placed in this encounter.  No orders of the defined types were placed in this encounter.     Follow-Up Instructions: Return in about 6 months (around 04/14/2019) for Psoriatic arthritis.   Gearldine Bienenstock, PA-C  Note - This record has been created using Dragon software.  Chart creation errors have been sought, but may not always  have been located. Such creation errors do not reflect on  the standard of medical care.

## 2018-09-28 NOTE — Telephone Encounter (Signed)
Received call from [facility] about patient's compliance with [place].  He reports missing his evening dose most nights.  Pharmacist reinforced importance of adherence and re-timed refill call as he still has almost a month supply left.  He has an appointment for 5/28 and will follow up about adherence at that visit.  Verlin Fester, PharmD, [facility] Rheumatology Clinical Pharmacist  09/28/2018 12:09 PM

## 2018-10-12 ENCOUNTER — Encounter: Payer: Self-pay | Admitting: Physician Assistant

## 2018-10-12 ENCOUNTER — Ambulatory Visit: Payer: Managed Care, Other (non HMO) | Admitting: Physician Assistant

## 2018-10-12 ENCOUNTER — Other Ambulatory Visit: Payer: Self-pay

## 2018-10-12 VITALS — BP 148/95 | HR 96 | Resp 14 | Ht 70.0 in | Wt 242.0 lb

## 2018-10-12 DIAGNOSIS — Z840 Family history of diseases of the skin and subcutaneous tissue: Secondary | ICD-10-CM

## 2018-10-12 DIAGNOSIS — Z79899 Other long term (current) drug therapy: Secondary | ICD-10-CM | POA: Diagnosis not present

## 2018-10-12 DIAGNOSIS — Z8659 Personal history of other mental and behavioral disorders: Secondary | ICD-10-CM

## 2018-10-12 DIAGNOSIS — F17200 Nicotine dependence, unspecified, uncomplicated: Secondary | ICD-10-CM

## 2018-10-12 DIAGNOSIS — L400 Psoriasis vulgaris: Secondary | ICD-10-CM | POA: Diagnosis not present

## 2018-10-12 DIAGNOSIS — M25461 Effusion, right knee: Secondary | ICD-10-CM | POA: Diagnosis not present

## 2018-10-12 DIAGNOSIS — M25462 Effusion, left knee: Secondary | ICD-10-CM

## 2018-10-12 DIAGNOSIS — L4050 Arthropathic psoriasis, unspecified: Secondary | ICD-10-CM | POA: Diagnosis not present

## 2018-10-12 DIAGNOSIS — Z8709 Personal history of other diseases of the respiratory system: Secondary | ICD-10-CM

## 2018-10-12 DIAGNOSIS — M461 Sacroiliitis, not elsewhere classified: Secondary | ICD-10-CM

## 2018-10-12 DIAGNOSIS — F4010 Social phobia, unspecified: Secondary | ICD-10-CM

## 2018-10-30 ENCOUNTER — Other Ambulatory Visit: Payer: Self-pay | Admitting: Rheumatology

## 2018-10-30 DIAGNOSIS — L400 Psoriasis vulgaris: Secondary | ICD-10-CM

## 2018-10-30 NOTE — Telephone Encounter (Signed)
Last Visit: 10/12/2018 Next Visit: 04/10/2019  Okay to refill per Dr. Deveshwar 

## 2018-11-06 MED FILL — OTEZLA 30 MG TABS: 30 | 30 days supply | Qty: 60 | Fill #0

## 2018-11-24 ENCOUNTER — Ambulatory Visit (INDEPENDENT_AMBULATORY_CARE_PROVIDER_SITE_OTHER): Payer: Managed Care, Other (non HMO) | Admitting: Family Medicine

## 2018-11-24 ENCOUNTER — Encounter: Payer: Self-pay | Admitting: Family Medicine

## 2018-11-24 ENCOUNTER — Other Ambulatory Visit: Payer: Self-pay

## 2018-11-24 VITALS — BP 150/98 | HR 117 | Temp 98.7°F | Ht 70.0 in | Wt 242.4 lb

## 2018-11-24 DIAGNOSIS — R030 Elevated blood-pressure reading, without diagnosis of hypertension: Secondary | ICD-10-CM | POA: Diagnosis not present

## 2018-11-24 DIAGNOSIS — N5089 Other specified disorders of the male genital organs: Secondary | ICD-10-CM | POA: Diagnosis not present

## 2018-11-24 NOTE — Patient Instructions (Signed)

## 2018-11-24 NOTE — Progress Notes (Signed)
Subjective:     Patient ID: Donald Pearson, male   DOB: 1975/06/27, 43 y.o.   MRN: [id number]  HPI   Patient is seen with 1 week history of right scrotal mass.  He notices just a week ago and has noticed some slight tenderness.  No injury.  He had vasectomy about 3 years ago.  Pain is relatively mild.  Patient has elevated blood pressure today initially 150/98 and this decreased to 142/80 after rest left arm seated.  He has been drinking more alcohol recently mostly in the form of bourbon sometimes over 3 ounces per day.  He had some weight gain recently.  Family history of hypertension in mother.  No recent headaches.  Wt Readings from Last 3 Encounters:  11/24/18 242 lb 6.4 oz (110 kg)  10/12/18 242 lb (109.8 kg)  07/14/18 237 lb 9.6 oz (107.8 kg)     Past Medical History:  Diagnosis Date  . Asthma   . Depression    Past Surgical History:  Procedure Laterality Date  . MOLE REMOVAL  2004  . VASECTOMY    . WISDOM TOOTH EXTRACTION      reports that he has been smoking cigarettes. He has a 1.80 pack-year smoking history. He has never used smokeless tobacco. He reports current alcohol use of about 7.0 standard drinks of alcohol per week. He reports that he does not use drugs. family history includes Diabetes in his maternal grandmother and mother; Healthy in his brother, daughter, and son; Heart disease in his maternal grandmother. Allergies  Allergen Reactions  . Penicillins     As a child, rash     Review of Systems  Constitutional: Negative for fatigue and unexpected weight change.  Eyes: Negative for visual disturbance.  Respiratory: Negative for cough, chest tightness and shortness of breath.   Cardiovascular: Negative for chest pain, palpitations and leg swelling.  Genitourinary: Negative for dysuria.  Neurological: Negative for dizziness, syncope, weakness, light-headedness and headaches.       Objective:   Physical Exam Constitutional:      Appearance: Normal  appearance. He is well-developed.  HENT:     Right Ear: External ear normal.     Left Ear: External ear normal.  Eyes:     Pupils: Pupils are equal, round, and reactive to light.  Neck:     Musculoskeletal: Neck supple.     Thyroid: No thyromegaly.  Cardiovascular:     Rate and Rhythm: Normal rate and regular rhythm.  Pulmonary:     Effort: Pulmonary effort is normal. No respiratory distress.     Breath sounds: Normal breath sounds. No wheezing or rales.  Genitourinary:    Comments: Left testicle is normal.  Right side of scrotum reveals approximate 1 cm cystic type lesion which is just inferior to the right testicle and is mildly tender to palpation and mobile.  No hernia Neurological:     Mental Status: He is alert and oriented to person, place, and time.        Assessment:     #1 right scrotal mass.  Suspect probably benign etiology such as spermatocele  #2 elevated blood pressure.  No prior diagnosis of hypertension.  Probably exacerbated by recent weight gain    Plan:     -Set up ultrasound to further assess scrotal mass  -We discussed nonpharmacologic management of elevated blood pressure and particularly recommended some weight loss and more consistent aerobic activity.  Of also suggested reducing alcohol consumption  -Monitor blood pressure  several times weekly and be in touch if consistently greater than 140/90  Kristian CoveyBruce W Evo Aderman MD [facility]

## 2018-12-04 MED FILL — OTEZLA 30 MG TABS: 30 | 30 days supply | Qty: 60 | Fill #1

## 2018-12-18 ENCOUNTER — Ambulatory Visit
Admission: RE | Admit: 2018-12-18 | Discharge: 2018-12-18 | Disposition: A | Discharge status: Home / Self Care | Payer: Managed Care, Other (non HMO) | Source: Ambulatory Visit | Attending: Family Medicine | Admitting: Family Medicine

## 2018-12-18 DIAGNOSIS — N5089 Other specified disorders of the male genital organs: Secondary | ICD-10-CM

## 2019-01-04 MED FILL — OTEZLA 30 MG TABS: 30 | 30 days supply | Qty: 60 | Fill #2

## 2019-01-17 ENCOUNTER — Encounter: Payer: Self-pay | Admitting: Family Medicine

## 2019-01-17 ENCOUNTER — Telehealth (INDEPENDENT_AMBULATORY_CARE_PROVIDER_SITE_OTHER): Payer: Managed Care, Other (non HMO) | Admitting: Family Medicine

## 2019-01-17 ENCOUNTER — Other Ambulatory Visit: Payer: Self-pay

## 2019-01-17 ENCOUNTER — Ambulatory Visit: Payer: Managed Care, Other (non HMO) | Admitting: Family Medicine

## 2019-01-17 DIAGNOSIS — F4010 Social phobia, unspecified: Secondary | ICD-10-CM | POA: Diagnosis not present

## 2019-01-17 DIAGNOSIS — F411 Generalized anxiety disorder: Secondary | ICD-10-CM

## 2019-01-17 MED ORDER — ESCITALOPRAM OXALATE 10 MG PO TABS: 10.0000 mg | ORAL_TABLET | Freq: Every day | ORAL | 3 refills | Status: DC

## 2019-01-17 NOTE — Progress Notes (Signed)
This visit type was conducted due to national recommendations for restrictions regarding the COVID-19 pandemic in an effort to limit this patient's exposure and mitigate transmission in our community.   Virtual Visit via Video Note  I connected with Donald Pearson on 01/17/19 at  8:15 AM EDT by a video enabled telemedicine application and verified that I am speaking with the correct person using two identifiers.  Location patient: home Location provider:work or home office Persons participating in the virtual visit: patient, provider  I discussed the limitations of evaluation and management by telemedicine and the availability of in person appointments. The patient expressed understanding and agreed to proceed.   HPI: Donald Pearson calls to discuss anxiety issues.  He has past history of social anxiety disorder and for years took Prozac.  At one point when he was taking this this seemed to lose its effect and he had increased anxiety and also some depression symptoms which seem to worsen with Prozac.  He has had increased anxiety symptoms now for several months.  Worse early in the morning.  He is seeing a therapist and has so for 2 years now.  Starting to have some sleep disturbance.  He does have some social anxiety but also has pervasive worries which have become daily and becoming more constant.  Anxiety does wax and wane somewhat.  He denies any suicidal ideation.  Currently not exercising regularly.  He does not recall trying any other medications other than Prozac previously.   ROS: See pertinent positives and negatives per HPI.  Past Medical History:  Diagnosis Date  . Asthma   . Depression     Past Surgical History:  Procedure Laterality Date  . MOLE REMOVAL  2004  . VASECTOMY    . WISDOM TOOTH EXTRACTION      Family History  Problem Relation Age of Onset  . Diabetes Mother        type ll  . Heart disease Maternal Grandmother   . Diabetes Maternal Grandmother        type ll   . Healthy Brother   . Healthy Son   . Healthy Daughter     SOCIAL HX: Long history of smoking.  No regular alcohol use.   Current Outpatient Medications:  .  albuterol (PROVENTIL HFA;VENTOLIN HFA) 108 (90 Base) MCG/ACT inhaler, Inhale 2 puffs into the lungs every 4 (four) hours as needed for wheezing., Disp: 1 Inhaler, Rfl: 1 .  cetirizine (ZYRTEC) 10 MG tablet, Take 10 mg by mouth daily., Disp: , Rfl:  .  fluticasone (FLONASE) 50 MCG/ACT nasal spray, Place 2 sprays into both nostrils daily., Disp: 16 g, Rfl: 6 .  Multiple Vitamin (MULTIVITAMIN PO), Take by mouth daily., Disp: , Rfl:  .  naproxen sodium (ALEVE) 220 MG tablet, Take 220 mg by mouth as needed. , Disp: , Rfl:  .  OTEZLA 30 MG TABS, TAKE 30 MG BY MOUTH 2 (TWO) TIMES DAILY., Disp: 180 tablet, Rfl: 0  EXAM:  VITALS per patient if applicable:  GENERAL: alert, oriented, appears well and in no acute distress  HEENT: atraumatic, conjunttiva clear, no obvious abnormalities on inspection of external nose and ears  NECK: normal movements of the head and neck  LUNGS: on inspection no signs of respiratory distress, breathing rate appears normal, no obvious gross SOB, gasping or wheezing  CV: no obvious cyanosis  MS: moves all visible extremities without noticeable abnormality  PSYCH/NEURO: pleasant and cooperative, no obvious depression or anxiety, speech and thought processing grossly  intact  ASSESSMENT AND PLAN:  Discussed the following assessment and plan:  Anxiety symptoms which have progressed over several months.  It sounds like he has a combination of social anxiety and possibly some generalized anxiety as well.  He has some depression symptoms as well  -Recommend trial of Lexapro 10 mg daily -Reviewed potential side effects and also the fact this may take a few weeks to see maximum effect -We will schedule I month follow-up to reassess -Continue counseling.  Also discussed other nonpharmacologic factors such as  exercise and getting regular sleep as much as possible    I discussed the assessment and treatment plan with the patient. The patient was provided an opportunity to ask questions and all were answered. The patient agreed with the plan and demonstrated an understanding of the instructions.   The patient was advised to call back or seek an in-person evaluation if the symptoms worsen or if the condition fails to improve as anticipated.   Evelena PeatBruce Beckett Maden, MD

## 2019-02-16 ENCOUNTER — Telehealth (INDEPENDENT_AMBULATORY_CARE_PROVIDER_SITE_OTHER): Payer: Managed Care, Other (non HMO) | Admitting: Family Medicine

## 2019-02-16 ENCOUNTER — Other Ambulatory Visit: Payer: Self-pay

## 2019-02-16 DIAGNOSIS — F4010 Social phobia, unspecified: Secondary | ICD-10-CM

## 2019-02-16 NOTE — Progress Notes (Signed)
This visit type was conducted due to national recommendations for restrictions regarding the COVID-19 pandemic in an effort to limit this patient's exposure and mitigate transmission in our community.   Virtual Visit via Video Note  I connected with Donald Pearson on 02/16/19 at  7:30 AM EDT by a video enabled telemedicine application and verified that I am speaking with the correct person using two identifiers.  Location patient: home Location provider:work or home office Persons participating in the virtual visit: patient, provider  I discussed the limitations of evaluation and management by telemedicine and the availability of in person appointments. The patient expressed understanding and agreed to proceed.   HPI: Follow-up regarding anxiety disorder.  Refer to previous note.  History of social anxiety disorder and possible generalized anxiety.  He is seeing a Veterinary surgeon.  He was not getting good results with Prozac and we started Lexapro 10 mg daily.  He feels this is working very well.  After about 2 weeks he saw substantial decrease in anxiety symptoms.  Also sleeping better.  No major side effects.  He feels the counseling is helping as well.  Overall pleased with the results.   ROS: See pertinent positives and negatives per HPI.  Past Medical History:  Diagnosis Date  . Asthma   . Depression     Past Surgical History:  Procedure Laterality Date  . MOLE REMOVAL  2004  . VASECTOMY    . WISDOM TOOTH EXTRACTION      Family History  Problem Relation Age of Onset  . Diabetes Mother        type ll  . Heart disease Maternal Grandmother   . Diabetes Maternal Grandmother        type ll  . Healthy Brother   . Healthy Son   . Healthy Daughter     SOCIAL HX: Non-smoker   Current Outpatient Medications:  .  albuterol (PROVENTIL HFA;VENTOLIN HFA) 108 (90 Base) MCG/ACT inhaler, Inhale 2 puffs into the lungs every 4 (four) hours as needed for wheezing., Disp: 1 Inhaler, Rfl:  1 .  cetirizine (ZYRTEC) 10 MG tablet, Take 10 mg by mouth daily., Disp: , Rfl:  .  escitalopram (LEXAPRO) 10 MG tablet, Take 1 tablet (10 mg total) by mouth daily., Disp: 90 tablet, Rfl: 3 .  fluticasone (FLONASE) 50 MCG/ACT nasal spray, Place 2 sprays into both nostrils daily., Disp: 16 g, Rfl: 6 .  Multiple Vitamin (MULTIVITAMIN PO), Take by mouth daily., Disp: , Rfl:  .  naproxen sodium (ALEVE) 220 MG tablet, Take 220 mg by mouth as needed. , Disp: , Rfl:  .  OTEZLA 30 MG TABS, TAKE 30 MG BY MOUTH 2 (TWO) TIMES DAILY., Disp: 180 tablet, Rfl: 0  EXAM:  VITALS per patient if applicable:  GENERAL: alert, oriented, appears well and in no acute distress  HEENT: atraumatic, conjunttiva clear, no obvious abnormalities on inspection of external nose and ears  NECK: normal movements of the head and neck  LUNGS: on inspection no signs of respiratory distress, breathing rate appears normal, no obvious gross SOB, gasping or wheezing  CV: no obvious cyanosis  MS: moves all visible extremities without noticeable abnormality  PSYCH/NEURO: pleasant and cooperative, no obvious depression or anxiety, speech and thought processing grossly intact  ASSESSMENT AND PLAN:  Discussed the following assessment and plan:  Social anxiety disorder-improved on Lexapro -Continue Lexapro 10 mg daily -We discussed that duration of therapy is difficult to define but would plan to treat indefinitely at this  point unless he decides to come off at some point     I discussed the assessment and treatment plan with the patient. The patient was provided an opportunity to ask questions and all were answered. The patient agreed with the plan and demonstrated an understanding of the instructions.   The patient was advised to call back or seek an in-person evaluation if the symptoms worsen or if the condition fails to improve as anticipated.   Carolann Littler, MD

## 2019-02-26 ENCOUNTER — Other Ambulatory Visit: Payer: Self-pay | Admitting: Rheumatology

## 2019-02-26 DIAGNOSIS — L400 Psoriasis vulgaris: Secondary | ICD-10-CM

## 2019-02-26 NOTE — Telephone Encounter (Signed)
Last Visit: 10/12/2018 Next Visit: 04/10/2019  Okay to refill per Dr. Estanislado Pandy

## 2019-02-27 MED FILL — OTEZLA 30 MG TABS: 30 | 30 days supply | Qty: 60 | Fill #0

## 2019-03-24 ENCOUNTER — Other Ambulatory Visit: Payer: Self-pay

## 2019-03-24 ENCOUNTER — Ambulatory Visit (INDEPENDENT_AMBULATORY_CARE_PROVIDER_SITE_OTHER): Payer: Managed Care, Other (non HMO)

## 2019-03-24 DIAGNOSIS — Z23 Encounter for immunization: Secondary | ICD-10-CM

## 2019-03-27 NOTE — Progress Notes (Deleted)
Office Visit Note  Patient: Donald Pearson             Date of Birth: Jan 23, 1976           MRN: [id number]             PCP: Eulas Post, MD Referring: Eulas Post, MD Visit Date: 04/10/2019 Occupation: @GUAROCC @  Subjective:  No chief complaint on file.   History of Present Illness: Donald Pearson is a 43 y.o. male ***   Activities of Daily Living:  Patient reports morning stiffness for *** {minute/hour:19697}.   Patient {ACTIONS;DENIES/REPORTS:21021675::"Denies"} nocturnal pain.  Difficulty dressing/grooming: {ACTIONS;DENIES/REPORTS:21021675::"Denies"} Difficulty climbing stairs: {ACTIONS;DENIES/REPORTS:21021675::"Denies"} Difficulty getting out of chair: {ACTIONS;DENIES/REPORTS:21021675::"Denies"} Difficulty using hands for taps, buttons, cutlery, and/or writing: {ACTIONS;DENIES/REPORTS:21021675::"Denies"}  No Rheumatology ROS completed.   PMFS History:  Patient Active Problem List   Diagnosis Date Noted  . 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 Family history of psoriasis 06/28/2018  . Smoker 06/28/2018  . Sacroiliitis ([place]) 06/28/2018  . Plaque psoriasis 12/02/2016  . Social anxiety disorder 07/15/2011  . History of depression 01/25/2011  . Asthma, mild intermittent 01/25/2011    Past Medical History:  Diagnosis Date  . Asthma   . Depression     Family History  Problem Relation Age of Onset  . Diabetes Mother        type ll  . Heart disease Maternal Grandmother   . Diabetes Maternal Grandmother        type ll  . Healthy Brother   . Healthy Son   . Healthy Daughter    Past Surgical History:  Procedure Laterality Date  . MOLE REMOVAL  2004  . VASECTOMY    . WISDOM TOOTH EXTRACTION     Social History   Social History Narrative  . Not on file   Immunization History  Administered Date(s) Administered  . Influenza,inj,Quad PF,6+ Mos 03/24/2019  . Influenza-Unspecified 03/17/2016, 03/08/2017  . Tdap 06/17/2014     Objective:  Vital Signs: There were no vitals taken for this visit.   Physical Exam   Musculoskeletal Exam: ***  CDAI Exam: CDAI Score: - Patient Global: -; Provider Global: - Swollen: -; Tender: - Joint Exam   No joint exam has been documented for this visit   There is currently no information documented on the homunculus. Go to the Rheumatology activity and complete the homunculus joint exam.  Investigation: No additional findings.  Imaging: No results found.  Recent Labs: Lab Results  Component Value Date   WBC 7.7 06/16/2018   HGB 16.0 06/16/2018   PLT 194 06/16/2018   NA 140 06/16/2018   K 4.1 06/16/2018   CL 105 06/16/2018   CO2 28 06/16/2018   GLUCOSE 81 06/16/2018   BUN 15 06/16/2018   CREATININE 1.00 06/16/2018   BILITOT 0.7 06/16/2018   ALKPHOS 79 11/01/2017   AST 22 06/16/2018   ALT 31 06/16/2018   PROT 7.0 06/16/2018   PROT 7.0 06/16/2018   ALBUMIN 4.8 11/01/2017   CALCIUM 9.7 06/16/2018   GFRAA 109 06/16/2018   QFTBGOLDPLUS NEGATIVE 06/16/2018    Speciality Comments: No specialty comments available.  Procedures:  No procedures performed Allergies: Penicillins   Assessment / Plan:     Visit Diagnoses: No diagnosis found.  Orders: No orders of the defined types were placed in this encounter.  No orders of the defined types were placed in this encounter.   Face-to-face time spent with patient was *** minutes. Greater than 50% of time  was spent in counseling and coordination of care.  Follow-Up Instructions: No follow-ups on file.   Ofilia Neas, PA-C  Note - This record has been created using Dragon software.  Chart creation errors have been sought, but may not always  have been located. Such creation errors do not reflect on  the standard of medical care.

## 2019-03-29 ENCOUNTER — Other Ambulatory Visit: Payer: Self-pay | Admitting: Rheumatology

## 2019-03-29 DIAGNOSIS — L400 Psoriasis vulgaris: Secondary | ICD-10-CM

## 2019-03-29 NOTE — Telephone Encounter (Signed)
Last Visit: 10/12/2018 Next Visit: 04/10/2019  Okay to refill per Dr. Deveshwar 

## 2019-04-02 MED FILL — OTEZLA 30 MG TABS: 30 | 30 days supply | Qty: 60 | Fill #0

## 2019-04-10 ENCOUNTER — Telehealth: Payer: Managed Care, Other (non HMO) | Admitting: Rheumatology

## 2019-04-10 NOTE — Progress Notes (Signed)
Virtual Visit via Telephone Note  I connected with Donald Pearson on 04/11/19 at  8:00 AM EST by telephone and verified that I am speaking with the correct person using two identifiers.  Location: Patient: Home Provider: Clinic  This service was conducted via virtual visit.  The patient was located at home. I was located in my office.  Consent was obtained prior to the virtual visit and is aware of possible charges through their insurance for this visit.  The patient is an established patient.  Dr. Corliss Skains, MD conducted the virtual visit and Sherron Ales, PA-C acted as scribe during the service.  Office staff helped with scheduling follow up visits after the service was conducted.     I discussed the limitations, risks, security and privacy concerns of performing an evaluation and management service by telephone and the availability of in person appointments. I also discussed with the patient that there may be a patient responsible charge related to this service. The patient expressed understanding and agreed to proceed.  CC: Psoriasis  History of Present Illness: Patient is a 43 year old male with a past medical history of psoriatic arthritis.  He is taking otezla 30 mg 1 tablet BID.  He denies any recent psoriatic arthritis flares.  He denies any joint pain or joint swelling.  He denies any morning stiffness.  He denies any achilles tendonitis or plantar fasciitis.  He states he has noticed more psoriasis on his knees and elbows recently.  He has been using topical agents, which have been helping.  Review of Systems  Constitutional: Negative for fever and malaise/fatigue.  Eyes: Negative for photophobia, pain, discharge and redness.  Respiratory: Negative for cough, shortness of breath and wheezing.   Cardiovascular: Negative for chest pain and palpitations.  Gastrointestinal: Negative for blood in stool, constipation and diarrhea.  Genitourinary: Negative for dysuria.  Musculoskeletal:  Negative for back pain, joint pain, myalgias and neck pain.  Skin: Positive for rash (Psoriasis ).  Neurological: Negative for dizziness and headaches.  Psychiatric/Behavioral: Negative for depression. The patient is not nervous/anxious and does not have insomnia.       Observations/Objective: Physical Exam  Constitutional: He is oriented to person, place, and time.  Neurological: He is alert and oriented to person, place, and time.  Psychiatric: Mood, memory, affect and judgment normal.    Patient reports morning stiffness for 0 minutes.   Patient denies nocturnal pain.  Difficulty dressing/grooming: Denies Difficulty climbing stairs: Denies Difficulty getting out of chair: Denies Difficulty using hands for taps, buttons, cutlery, and/or writing: Denies  Assessment and Plan: Visit Diagnoses: Psoriatic arthropathy (HCC): He has not had any recent psoriatic arthritis flares.  He has no joint pain or joint swelling at this time.  No recurrence of knee joint effusions.  He has not had any achilles tendonitis or plantar fasciitis.  He has ongoing SI joint discomfort but it has been tolerable.  He has no morning stiffness.  He takes Aleve as needed for discomfort.  He is taking Otezla 30 mg BID, and he does not want to make any changes at this time.  He was advised to notify us if he develops increased joint pain or joint swelling.  He will follow up in 6 months.   Plaque psoriasis: He has noticed increased plaques of psoriasis on his elbows and knees.  He has been using topical agents, which have been clearing the patches.  He will continue on otezla 30 mg BID.  He does not  want more aggressive treatments at this time.   High risk medication use -  He started on Otezla 30 mg twice daily in February 2020.  He will be having lab work drawn for his yearly physical and will have it sent to our office.    Effusion of both knee joints: Resolved.  No recurrence.   Sacroiliitis [facility]): He has  ongoing discomfort in bilateral SI joints.  He reports the discomfort is manageable and he does not want to make any changes to his current treatment regimen.   Other medical conditions are listed as follows:  Family history of psoriasis  Social anxiety disorder  Smoker  History of depression  History of asthma    Follow Up Instructions: He will follow up in 6 months.    I discussed the assessment and treatment plan with the patient. The patient was provided an opportunity to ask questions and all were answered. The patient agreed with the plan and demonstrated an understanding of the instructions.   The patient was advised to call back or seek an in-person evaluation if the symptoms worsen or if the condition fails to improve as anticipated.  I provided 15 minutes of non-face-to-face time during this encounter.   Bo Merino, MD   Scribed by-  Hazel Sams, PA-C

## 2019-04-11 ENCOUNTER — Other Ambulatory Visit: Payer: Self-pay

## 2019-04-11 ENCOUNTER — Telehealth (INDEPENDENT_AMBULATORY_CARE_PROVIDER_SITE_OTHER): Payer: Managed Care, Other (non HMO) | Admitting: Rheumatology

## 2019-04-11 ENCOUNTER — Encounter: Payer: Self-pay | Admitting: Rheumatology

## 2019-04-11 DIAGNOSIS — Z79899 Other long term (current) drug therapy: Secondary | ICD-10-CM

## 2019-04-11 DIAGNOSIS — Z8659 Personal history of other mental and behavioral disorders: Secondary | ICD-10-CM

## 2019-04-11 DIAGNOSIS — L400 Psoriasis vulgaris: Secondary | ICD-10-CM | POA: Diagnosis not present

## 2019-04-11 DIAGNOSIS — M25461 Effusion, right knee: Secondary | ICD-10-CM

## 2019-04-11 DIAGNOSIS — M461 Sacroiliitis, not elsewhere classified: Secondary | ICD-10-CM

## 2019-04-11 DIAGNOSIS — M25462 Effusion, left knee: Secondary | ICD-10-CM

## 2019-04-11 DIAGNOSIS — L4050 Arthropathic psoriasis, unspecified: Secondary | ICD-10-CM | POA: Diagnosis not present

## 2019-04-11 DIAGNOSIS — F4010 Social phobia, unspecified: Secondary | ICD-10-CM

## 2019-04-11 DIAGNOSIS — Z840 Family history of diseases of the skin and subcutaneous tissue: Secondary | ICD-10-CM

## 2019-04-11 DIAGNOSIS — Z8709 Personal history of other diseases of the respiratory system: Secondary | ICD-10-CM

## 2019-04-11 DIAGNOSIS — F17200 Nicotine dependence, unspecified, uncomplicated: Secondary | ICD-10-CM

## 2019-04-24 ENCOUNTER — Telehealth: Payer: Self-pay | Admitting: Rheumatology

## 2019-04-24 NOTE — Telephone Encounter (Signed)
I LMOM for patient to call, and schedule follow up appointment for 6 months (around 10/12/2019) with Hazel Sams, PAC.

## 2019-04-24 NOTE — Telephone Encounter (Signed)
-----   Message from Shona Needles, RT sent at 04/17/2019  3:09 PM EST ----- Regarding: F/U IN OFFICE IN 6 MONTHS

## 2019-05-04 ENCOUNTER — Other Ambulatory Visit: Payer: Self-pay | Admitting: Rheumatology

## 2019-05-04 DIAGNOSIS — L400 Psoriasis vulgaris: Secondary | ICD-10-CM

## 2019-05-04 NOTE — Telephone Encounter (Signed)
Last Visit: 04/11/2019 telemedicine  Next Visit: message sent to the front desk to schedule.  Okay to refill per Dr. Deveshwar.  

## 2019-05-08 MED FILL — OTEZLA 30 MG TABS: 30 | 30 days supply | Qty: 60 | Fill #0

## 2019-06-04 ENCOUNTER — Other Ambulatory Visit: Payer: Self-pay | Admitting: Rheumatology

## 2019-06-04 DIAGNOSIS — L400 Psoriasis vulgaris: Secondary | ICD-10-CM

## 2019-06-04 NOTE — Telephone Encounter (Signed)
Last Visit: 04/11/2019 telemedicine  Next Visit: message sent to the front desk to schedule.  Okay to refill per Dr. Corliss Skains.

## 2019-06-04 NOTE — Telephone Encounter (Signed)
Please schedule patient for a follow up visit. Patient due May 2021. Thanks! 

## 2019-06-14 MED FILL — OTEZLA 30 MG TABS: 30 | 30 days supply | Qty: 60 | Fill #0

## 2019-07-11 ENCOUNTER — Other Ambulatory Visit: Payer: Self-pay | Admitting: Rheumatology

## 2019-07-11 DIAGNOSIS — L400 Psoriasis vulgaris: Secondary | ICD-10-CM

## 2019-07-11 NOTE — Telephone Encounter (Signed)
Last Visit: 04/11/2019 telemedicine  Next Visit: 09/24/2019  Okay to refill per Dr. Corliss Skains

## 2019-07-19 MED FILL — OTEZLA 30 MG TABS: 30 | 30 days supply | Qty: 60 | Fill #0

## 2019-08-02 ENCOUNTER — Ambulatory Visit: Payer: Managed Care, Other (non HMO) | Attending: Internal Medicine

## 2019-08-02 DIAGNOSIS — Z23 Encounter for immunization: Secondary | ICD-10-CM

## 2019-08-02 NOTE — Progress Notes (Signed)
   Covid-19 Vaccination Clinic  Name:  Donald Pearson    MRN: [id number] DOB: [birth year]  08/02/2019  [name] was observed post Covid-19 immunization for 15 minutes without incident. He was provided with Vaccine Information Sheet and instruction to access the V-Safe system.   [name] was instructed to call 911 with any severe reactions post vaccine: Marland Kitchen Difficulty breathing  . Swelling of face and throat  . A fast heartbeat  . A bad rash all over body  . Dizziness and weakness   Immunizations Administered    Name Date Dose VIS Date Route   Pfizer COVID-19 Vaccine 08/02/2019  2:33 PM 0.3 mL 04/27/2019 Intramuscular   Manufacturer: [employer], [employer]   Lot: [id number]   NDC: [address]-8

## 2019-08-16 MED FILL — OTEZLA 30 MG TABS: 30 | 30 days supply | Qty: 60 | Fill #1

## 2019-08-27 ENCOUNTER — Ambulatory Visit: Payer: Managed Care, Other (non HMO) | Attending: Internal Medicine

## 2019-08-27 DIAGNOSIS — Z23 Encounter for immunization: Secondary | ICD-10-CM

## 2019-08-27 NOTE — Progress Notes (Signed)
   Covid-19 Vaccination Clinic  Name:  Marin Milley    MRN: [id number] DOB: [birth year]  08/27/2019  Mr. Vandevoorde was observed post Covid-19 immunization for 15 minutes without incident. He was provided with Vaccine Information Sheet and instruction to access the V-Safe system.   Mr. Bredeson was instructed to call 911 with any severe reactions post vaccine: Marland Kitchen Difficulty breathing  . Swelling of face and throat  . A fast heartbeat  . A bad rash all over body  . Dizziness and weakness   Immunizations Administered    Name Date Dose VIS Date Route   Pfizer COVID-19 Vaccine 08/27/2019  3:19 PM 0.3 mL 04/27/2019 Intramuscular   Manufacturer: [employer], [employer]   Lot: [id number]   NDC: [address]-5

## 2019-09-17 NOTE — Progress Notes (Signed)
Office Visit Note  Patient: Donald Pearson             Date of Birth: [birth year]           MRN: [id number]             PCP: Kristian Covey, MD Referring: Kristian Covey, MD Visit Date: 09/24/2019 Occupation: @GUAROCC @  Subjective:  Medication monitoring  History of Present Illness: Donald Pearson is a 44 y.o. male with history of psoriatic arthritis.  He is taking otezla 30 mg 1 tablet by mouth twice daily.  He has not missed any doses of Otezla recently.  He is tolerating Otezla without any side effects.  He denies any recent psoriatic arthritis flares.  He denies any joint pain or joint swelling at this time.  He experiences joint stiffness if he is sitting for prolonged periods of time especially long car rides.  He denies any Achilles tendinitis or plantar fasciitis.  He denies any SI joint pain.  He continues to have a few small patches of psoriasis on his elbows and knees but states it has been very well controlled on 46.  He has not needed to use topical agents recently.  He denies any new concerns.     Activities of Daily Living:  Patient reports morning stiffness for 0  minute.   Patient Denies nocturnal pain.  Difficulty dressing/grooming: Denies Difficulty climbing stairs: Denies Difficulty getting out of chair: Denies Difficulty using hands for taps, buttons, cutlery, and/or writing: Denies  Review of Systems  Constitutional: Negative for fatigue and night sweats.  HENT: Negative for mouth sores, mouth dryness and nose dryness.   Eyes: Negative for redness and dryness.  Respiratory: Negative for cough, hemoptysis, shortness of breath and difficulty breathing.   Cardiovascular: Negative for chest pain, palpitations, hypertension, irregular heartbeat and swelling in legs/feet.  Gastrointestinal: Negative for blood in stool, constipation and diarrhea.  Endocrine: Negative for increased urination.  Genitourinary: Negative for painful urination.    Musculoskeletal: Negative for arthralgias, joint pain, joint swelling, myalgias, muscle weakness, morning stiffness, muscle tenderness and myalgias.  Skin: Negative for color change, rash, hair loss, nodules/bumps, skin tightness, ulcers and sensitivity to sunlight.  Allergic/Immunologic: Negative for susceptible to infections.  Neurological: Negative for dizziness, fainting, memory loss, night sweats and weakness.  Hematological: Negative for swollen glands.  Psychiatric/Behavioral: Negative for depressed mood and sleep disturbance. The patient is not nervous/anxious.     PMFS History:  Patient Active Problem List   Diagnosis Date Noted  . Psoriatic arthropathy (HCC) 06/28/2018  . Family history of psoriasis 06/28/2018  . Smoker 06/28/2018  . Sacroiliitis ([facility]) 06/28/2018  . Plaque psoriasis 12/02/2016  . Social anxiety disorder 07/15/2011  . History of depression 01/25/2011  . Asthma, mild intermittent 01/25/2011    Past Medical History:  Diagnosis Date  . Asthma   . Depression   . Psoriatic arthropathy ([facility]CC)     Family History  Problem Relation Age of Onset  . Diabetes Mother        type ll  . Heart disease Maternal Grandmother   . Diabetes Maternal Grandmother        type ll  . Healthy Brother   . Healthy Son   . Healthy Daughter    Past Surgical History:  Procedure Laterality Date  . MOLE REMOVAL  2004  . VASECTOMY    . WISDOM TOOTH EXTRACTION     Social History   Social History Narrative  . Not on  file   Immunization History  Administered Date(s) Administered  . Influenza,inj,Quad PF,6+ Mos 03/24/2019  . Influenza-Unspecified 03/17/2016, 03/08/2017  . PFIZER SARS-COV-2 Vaccination 08/02/2019, 08/27/2019  . Tdap 06/17/2014     Objective: Vital Signs: BP (!) 152/94 (BP Location: Left Arm, Patient Position: Sitting, Cuff Size: Normal)   Pulse 93   Resp 14   Ht 5\' 10"  (1.778 m)   Wt 239 lb (108.4 kg)   BMI 34.29 kg/m    Physical Exam Vitals and  nursing note reviewed.  Constitutional:      Appearance: He is well-developed.  HENT:     Head: Normocephalic and atraumatic.  Eyes:     Conjunctiva/sclera: Conjunctivae normal.     Pupils: Pupils are equal, round, and reactive to light.  Pulmonary:     Effort: Pulmonary effort is normal.  Abdominal:     General: Bowel sounds are normal.     Palpations: Abdomen is soft.  Musculoskeletal:     Cervical back: Normal range of motion and neck supple.  Skin:    General: Skin is warm and dry.     Capillary Refill: Capillary refill takes less than 2 seconds.     Comments: Small patches of psoriasis on the extensor surface of both knees.  Neurological:     Mental Status: He is alert and oriented to person, place, and time.  Psychiatric:        Behavior: Behavior normal.      Musculoskeletal Exam: C-spine, thoracic spine, lumbar spine good range of motion.  No midline spinal tenderness.  No SI joint tenderness.  Shoulder joints, elbow joints, wrist joints, MCPs, PIPs, DIPs good range of motion with no synovitis.  He has complete fist formation bilaterally.  Hip joints have good range of motion with no discomfort.  Knee joints have good range of motion with no warmth or effusion.  Ankle joints have good range of motion no tenderness or inflammation.  No Achilles denies or plantar fasciitis. No MTP joint tenderness.    CDAI Exam: CDAI Score: -- Patient Global: --; Provider Global: -- Swollen: --; Tender: -- Joint Exam 09/24/2019   No joint exam has been documented for this visit   There is currently no information documented on the homunculus. Go to the Rheumatology activity and complete the homunculus joint exam.  Investigation: No additional findings.  Imaging: No results found.  Recent Labs: Lab Results  Component Value Date   WBC 7.7 06/16/2018   HGB 16.0 06/16/2018   PLT 194 06/16/2018   NA 140 06/16/2018   K 4.1 06/16/2018   CL 105 06/16/2018   CO2 28 06/16/2018    GLUCOSE 81 06/16/2018   BUN 15 06/16/2018   CREATININE 1.00 06/16/2018   BILITOT 0.7 06/16/2018   ALKPHOS 79 11/01/2017   AST 22 06/16/2018   ALT 31 06/16/2018   PROT 7.0 06/16/2018   PROT 7.0 06/16/2018   ALBUMIN 4.8 11/01/2017   CALCIUM 9.7 06/16/2018   GFRAA 109 06/16/2018   QFTBGOLDPLUS NEGATIVE 06/16/2018    Speciality Comments: No specialty comments available.  Procedures:  No procedures performed Allergies: Penicillins   Assessment / Plan:     Visit Diagnoses: Psoriatic arthropathy (HCC): He has no synovitis or dactylitis on exam.  He has no joint pain or inflammation at this time.  He has not had any Achilles tendinitis or plantar fasciitis.  He has no SI joint tenderness on exam.  He has not had any recent psoriatic arthritis flares. He has  a few small patches of psoriasis on the extensor surface of both knees.  He has not needed topical agents recently. He is clinically doing well on Otezla 30 mg 1 tablet by mouth twice daily.  He will continue taking Otezla as prescribed.  He was advised to notify us if he develops increased joint pain or joint swelling. He will follow-up in the office in 5 months.  Plaque psoriasis: He has a few patches of psoriasis on the extensor surface of both knees.  He has not needed any topical agents. He will continue taking otezla as prescribed.   High risk medication use - He started on Otezla 30 mg twice daily in February 2020.    Effusion of both knee joints: Resolved.  He has no warmth or effusion of knee joints on exam.   Sacroiliitis [facility]): He has no SI joint tenderness on exam.   Other medical conditions are listed as follows:   Family history of psoriasis  Social anxiety disorder  Smoker  History of depression  History of asthma  Orders: No orders of the defined types were placed in this encounter.  No orders of the defined types were placed in this encounter.    Follow-Up Instructions: Return in about 5 months (around  02/24/2020) for Psoriatic arthritis.   Ofilia Neas, PA-C  Note - This record has been created using Dragon software.  Chart creation errors have been sought, but may not always  have been located. Such creation errors do not reflect on  the standard of medical care.

## 2019-09-24 ENCOUNTER — Ambulatory Visit: Payer: Managed Care, Other (non HMO) | Admitting: Physician Assistant

## 2019-09-24 ENCOUNTER — Encounter: Payer: Self-pay | Admitting: Physician Assistant

## 2019-09-24 ENCOUNTER — Other Ambulatory Visit: Payer: Self-pay

## 2019-09-24 VITALS — BP 152/94 | HR 93 | Resp 14 | Ht 70.0 in | Wt 239.0 lb

## 2019-09-24 DIAGNOSIS — Z8709 Personal history of other diseases of the respiratory system: Secondary | ICD-10-CM

## 2019-09-24 DIAGNOSIS — Z840 Family history of diseases of the skin and subcutaneous tissue: Secondary | ICD-10-CM

## 2019-09-24 DIAGNOSIS — M25461 Effusion, right knee: Secondary | ICD-10-CM | POA: Diagnosis not present

## 2019-09-24 DIAGNOSIS — M461 Sacroiliitis, not elsewhere classified: Secondary | ICD-10-CM

## 2019-09-24 DIAGNOSIS — L400 Psoriasis vulgaris: Secondary | ICD-10-CM | POA: Diagnosis not present

## 2019-09-24 DIAGNOSIS — M25462 Effusion, left knee: Secondary | ICD-10-CM

## 2019-09-24 DIAGNOSIS — Z79899 Other long term (current) drug therapy: Secondary | ICD-10-CM | POA: Diagnosis not present

## 2019-09-24 DIAGNOSIS — L4050 Arthropathic psoriasis, unspecified: Secondary | ICD-10-CM | POA: Diagnosis not present

## 2019-09-24 DIAGNOSIS — Z8659 Personal history of other mental and behavioral disorders: Secondary | ICD-10-CM

## 2019-09-24 DIAGNOSIS — F17200 Nicotine dependence, unspecified, uncomplicated: Secondary | ICD-10-CM

## 2019-09-24 DIAGNOSIS — F4010 Social phobia, unspecified: Secondary | ICD-10-CM

## 2019-11-26 ENCOUNTER — Other Ambulatory Visit: Payer: Self-pay | Admitting: Rheumatology

## 2019-11-26 ENCOUNTER — Other Ambulatory Visit: Payer: Self-pay | Admitting: Physician Assistant

## 2019-11-26 DIAGNOSIS — L400 Psoriasis vulgaris: Secondary | ICD-10-CM

## 2019-11-26 NOTE — Telephone Encounter (Signed)
Last Visit: 09/24/2019 Next Visit: 02/25/2020  Current Dose per office note on 09/24/2019: Otezla 30 mg 1 tablet by mouth twice daily.   DX: Psoriatic arthropathy  Okay to refill otezla?

## 2019-11-27 MED FILL — OTEZLA 30 MG TABS: 30 | 30 days supply | Qty: 60 | Fill #0

## 2019-12-26 MED FILL — OTEZLA 30 MG TABS: 30 | 30 days supply | Qty: 60 | Fill #1

## 2020-01-26 ENCOUNTER — Other Ambulatory Visit: Payer: Self-pay | Admitting: Family Medicine

## 2020-01-29 ENCOUNTER — Other Ambulatory Visit: Payer: Self-pay

## 2020-01-29 ENCOUNTER — Ambulatory Visit (INDEPENDENT_AMBULATORY_CARE_PROVIDER_SITE_OTHER): Payer: Managed Care, Other (non HMO)

## 2020-01-29 DIAGNOSIS — Z23 Encounter for immunization: Secondary | ICD-10-CM

## 2020-02-11 NOTE — Progress Notes (Signed)
Office Visit Note  Patient: Donald Pearson             Date of Birth: 1976-03-27           MRN: [id number]             PCP: Kristian Covey, MD Referring: Kristian Covey, MD Visit Date: 02/25/2020 Occupation: @GUAROCC @  Subjective:  Morning stiffness  History of Present Illness: Donald Pearson is a 44 y.o. male with history of psoriatic arthritis.  He is taking otezla 30 mg 1 tablet by mouth twice daily.  He denies any recent psoriatic arthritis flares.  He is not experiencing any joint pain or joint swelling at this time.  He does experience morning stiffness in his lower back on a daily basis.  He denies any SI joint pain at this time.  He denies any Achilles tendinitis or plantar fasciitis.  He states that he does have a plaque of psoriasis on the anterior surface of his right knee.  He states that it has not been bad enough for him to use any topical agents recently. He denies any recent infections.  He has received both COVID-19 vaccinations.  He has also received annual influenza vaccine. He has upcoming lab work which will be drawn at his work which he will have faxed to our office.   Activities of Daily Living:  Patient reports morning stiffness for 30 minutes.   Patient Denies nocturnal pain.  Difficulty dressing/grooming: Denies Difficulty climbing stairs: Denies Difficulty getting out of chair: Denies Difficulty using hands for taps, buttons, cutlery, and/or writing: Denies  Review of Systems  Constitutional: Positive for fatigue.  HENT: Negative for mouth sores, mouth dryness and nose dryness.   Eyes: Negative for pain, itching, visual disturbance and dryness.  Respiratory: Negative for cough, hemoptysis, shortness of breath and difficulty breathing.   Cardiovascular: Negative for chest pain, palpitations and swelling in legs/feet.  Gastrointestinal: Positive for constipation and diarrhea. Negative for abdominal pain and blood in stool.  Endocrine: Negative  for increased urination.  Genitourinary: Negative for painful urination.  Musculoskeletal: Positive for arthralgias, joint pain and morning stiffness. Negative for joint swelling, myalgias, muscle weakness, muscle tenderness and myalgias.  Skin: Negative for color change, rash and redness.  Allergic/Immunologic: Negative for susceptible to infections.  Neurological: Negative for dizziness, headaches, memory loss and weakness.  Hematological: Negative for swollen glands.  Psychiatric/Behavioral: Negative for confusion and sleep disturbance.    PMFS History:  Patient Active Problem List   Diagnosis Date Noted   Psoriatic arthropathy (HCC) 06/28/2018   Family history of psoriasis 06/28/2018   Smoker 06/28/2018   Sacroiliitis (HCC) 06/28/2018   Plaque psoriasis 12/02/2016   Social anxiety disorder 07/15/2011   History of depression 01/25/2011   Asthma, mild intermittent 01/25/2011    Past Medical History:  Diagnosis Date   Asthma    Depression    Psoriatic arthropathy (HCC)     Family History  Problem Relation Age of Onset   Diabetes Mother        type ll   Heart disease Maternal Grandmother    Diabetes Maternal Grandmother        type ll   Healthy Brother    Healthy Son    Healthy Daughter    Past Surgical History:  Procedure Laterality Date   MOLE REMOVAL  2004   VASECTOMY     WISDOM TOOTH EXTRACTION     Social History   Social History Narrative   Not  on file   Immunization History  Administered Date(s) Administered   Influenza,inj,Quad PF,6+ Mos 03/24/2019, 01/29/2020   Influenza-Unspecified 03/17/2016, 03/08/2017   PFIZER SARS-COV-2 Vaccination 08/02/2019, 08/27/2019   Tdap 06/17/2014     Objective: Vital Signs: BP (!) 145/92 (BP Location: Left Arm, Patient Position: Sitting, Cuff Size: Small)    Pulse 84    Ht 5\' 10"  (1.778 m)    Wt 251 lb (113.9 kg)    BMI 36.01 kg/m    Physical Exam Vitals and nursing note reviewed.    Constitutional:      Appearance: He is well-developed.  HENT:     Head: Normocephalic and atraumatic.  Eyes:     Conjunctiva/sclera: Conjunctivae normal.     Pupils: Pupils are equal, round, and reactive to light.  Pulmonary:     Effort: Pulmonary effort is normal.  Abdominal:     Palpations: Abdomen is soft.  Musculoskeletal:     Cervical back: Normal range of motion and neck supple.  Skin:    General: Skin is warm and dry.     Capillary Refill: Capillary refill takes less than 2 seconds.  Neurological:     Mental Status: He is alert and oriented to person, place, and time.  Psychiatric:        Behavior: Behavior normal.      Musculoskeletal Exam: C-spine, thoracic spine, lumbar spine have good range of motion.  No midline spinal tenderness.  No SI joint tenderness.  Shoulder joints and elbow joints have good range of motion with no discomfort.  Limited range of motion of the right wrist joint but no tenderness or synovitis was noted.  MCPs, PIPs, DIPs have good range of motion with no synovitis.  He has complete fist formation bilaterally.  Hip joints have good range of motion with no discomfort.  Knee joints have good range of motion with no warmth or effusion.  Ankle joints have good range of motion with no tenderness or inflammation.  No tenderness of Achilles tendon or plantar fascia.  No tenderness of MTP joints.  CDAI Exam: CDAI Score: 0  Patient Global: 0 mm; Provider Global: 0 mm Swollen: 0 ; Tender: 0  Joint Exam 02/25/2020   No joint exam has been documented for this visit   There is currently no information documented on the homunculus. Go to the Rheumatology activity and complete the homunculus joint exam.  Investigation: No additional findings.  Imaging: No results found.  Recent Labs: Lab Results  Component Value Date   WBC 7.7 06/16/2018   HGB 16.0 06/16/2018   PLT 194 06/16/2018   NA 140 06/16/2018   K 4.1 06/16/2018   CL 105 06/16/2018   CO2 28  06/16/2018   GLUCOSE 81 06/16/2018   BUN 15 06/16/2018   CREATININE 1.00 06/16/2018   BILITOT 0.7 06/16/2018   ALKPHOS 79 11/01/2017   AST 22 06/16/2018   ALT 31 06/16/2018   PROT 7.0 06/16/2018   PROT 7.0 06/16/2018   ALBUMIN 4.8 11/01/2017   CALCIUM 9.7 06/16/2018   GFRAA 109 06/16/2018   QFTBGOLDPLUS NEGATIVE 06/16/2018    Speciality Comments: No specialty comments available.  Procedures:  No procedures performed Allergies: Penicillins   Assessment / Plan:     Visit Diagnoses: Psoriatic arthropathy (HCC): He has no synovitis or dactylitis on exam.  He has not had any recent psoriatic arthritis flares.  He is clinically doing well on Otezla 30 mg 1 tablet by mouth twice daily.  He has not  missed any doses of Otezla recently.  He has been tolerating Otezla without any side effects.  He is not experiencing any joint pain and has no joint swelling on examination today.  His morning stiffness is continue to last for about 30 minutes on a daily basis.  He has no SI joint tenderness on exam today.  No Achilles tendinitis or plantar fasciitis.  He has a patch of psoriasis on the anterior surface of his right knee.  He declined a prescription for topical agent at this time.  He will continue taking Otezla 30 mg 1 tablet by mouth twice daily.  He does not need a refill at this time.  He was advised to notify us if he develops increased joint pain or joint swelling.  He will follow-up in the office in 5 months.  Plaque psoriasis: He has a patch of plaque psoriasis on the anterior surface of the right knee.  He declined a prescription for a topical agent at this time. He will continue to take otezla as prescribed.   High risk medication use - Otezla 30 mg 1 tablet by mouth twice daily (started in February 2020).  He will be having lab work drawn at his work soon and will have the results faxed to our office. He has not had any recent infections.  He has received both COVID-19 vaccinations.  He was  encouraged to receive the third dose.  He is advised to notify us or PCP if he develops a COVID-19 infection in order to receive the antibody infusion.  He was advised to continue to wear a mask and social distance.  He voiced understanding. He has received the annual influenza vaccine.  Effusion of both knee joints: Resolved.  He has good ROM with no discomfort.  No warmth or effusion noted.   Sacroiliitis [facility]): No SI joint tenderness on exam.  He has morning stiffness in his lower back for about 30 minutes daily.  He has not been experiencing any nocturnal pain.  He was given a handout of exercises to perform.    Other medical conditions are listed as follows:   Family history of psoriasis  Social anxiety disorder  Smoker  History of asthma  History of depression  Orders: No orders of the defined types were placed in this encounter.  No orders of the defined types were placed in this encounter.     Follow-Up Instructions: Return in about 5 months (around 07/25/2020) for Psoriatic arthritis.   Gearldine Bienenstock, PA-C  Note - This record has been created using Dragon software.  Chart creation errors have been sought, but may not always  have been located. Such creation errors do not reflect on  the standard of medical care.

## 2020-02-18 MED FILL — OTEZLA 30 MG TABS: 30 | 30 days supply | Qty: 60 | Fill #2

## 2020-02-25 ENCOUNTER — Encounter: Payer: Self-pay | Admitting: Physician Assistant

## 2020-02-25 ENCOUNTER — Other Ambulatory Visit: Payer: Self-pay

## 2020-02-25 ENCOUNTER — Ambulatory Visit: Payer: Managed Care, Other (non HMO) | Admitting: Physician Assistant

## 2020-02-25 VITALS — BP 145/92 | HR 84 | Ht 70.0 in | Wt 251.0 lb

## 2020-02-25 DIAGNOSIS — Z79899 Other long term (current) drug therapy: Secondary | ICD-10-CM

## 2020-02-25 DIAGNOSIS — M25461 Effusion, right knee: Secondary | ICD-10-CM

## 2020-02-25 DIAGNOSIS — L400 Psoriasis vulgaris: Secondary | ICD-10-CM | POA: Diagnosis not present

## 2020-02-25 DIAGNOSIS — L4050 Arthropathic psoriasis, unspecified: Secondary | ICD-10-CM

## 2020-02-25 DIAGNOSIS — M461 Sacroiliitis, not elsewhere classified: Secondary | ICD-10-CM

## 2020-02-25 DIAGNOSIS — Z840 Family history of diseases of the skin and subcutaneous tissue: Secondary | ICD-10-CM

## 2020-02-25 DIAGNOSIS — M25462 Effusion, left knee: Secondary | ICD-10-CM

## 2020-02-25 DIAGNOSIS — F4010 Social phobia, unspecified: Secondary | ICD-10-CM

## 2020-02-25 DIAGNOSIS — Z8659 Personal history of other mental and behavioral disorders: Secondary | ICD-10-CM

## 2020-02-25 DIAGNOSIS — Z8709 Personal history of other diseases of the respiratory system: Secondary | ICD-10-CM

## 2020-02-25 DIAGNOSIS — F17200 Nicotine dependence, unspecified, uncomplicated: Secondary | ICD-10-CM

## 2020-02-25 NOTE — Patient Instructions (Addendum)
COVID-19 vaccine recommendations:   COVID-19 vaccine is recommended for everyone (unless you are allergic to a vaccine component), even if you are on a medication that suppresses your immune system.   If you are on Methotrexate, Cellcept (mycophenolate), Rinvoq, Harriette Ohara, and Olumiant- hold the medication for 1 week after each vaccine. Hold Methotrexate for 2 weeks after the single dose COVID-19 vaccine.   If you are on Orencia subcutaneous injection - hold medication one week prior to and one week after the first COVID-19 vaccine dose (only).   If you are on Orencia IV infusions- time vaccination administration so that the first COVID-19 vaccination will occur four weeks after the infusion and postpone the subsequent infusion by one week.   If you are on Cyclophosphamide or Rituxan infusions please contact your doctor prior to receiving the COVID-19 vaccine.   Do not take Tylenol or any anti-inflammatory medications (NSAIDs) 24 hours prior to the COVID-19 vaccination.   There is no direct evidence about the efficacy of the COVID-19 vaccine in individuals who are on medications that suppress the immune system.   Even if you are fully vaccinated, and you are on any medications that suppress your immune system, please continue to wear a mask, maintain at least six feet social distance and practice hand hygiene.   If you develop a COVID-19 infection, please contact your PCP or our office to determine if you need antibody infusion.  The booster vaccine is now available for immunocompromised patients. It is advised that if you had Pfizer vaccine you should get [employer] booster.  If you had a Moderna vaccine then you should get a Moderna booster. Johnson and Laural [employer] does not have a booster vaccine at this time.  Please see the following web sites for updated information.    https://www.rheumatology.org/Portals/0/Files/COVID-19-Vaccination-Patient-Resources.pdf  https://www.rheumatology.org/About-Us/Newsroom/Press-Releases/ID/1159   Sacroiliac Joint Dysfunction  Sacroiliac joint dysfunction is a condition that causes inflammation on one or both sides of the sacroiliac (SI) joint. The SI joint connects the lower part of the spine (sacrum) with the two upper portions of the pelvis (ilium). This condition causes deep aching or burning pain in the low back. In some cases, the pain may also spread into one or both buttocks, hips, or thighs. What are the causes? This condition may be caused by:  Pregnancy. During pregnancy, extra stress is put on the SI joints because the pelvis widens.  Injury, such as: ? Injuries from car accidents. ? Sports-related injuries. ? Work-related injuries.  Having one leg that is shorter than the other.  Conditions that affect the joints, such as: ? Rheumatoid arthritis. ? Gout. ? Psoriatic arthritis. ? Joint infection (septic arthritis). Sometimes, the cause of SI joint dysfunction is not known. What are the signs or symptoms? Symptoms of this condition include:  Aching or burning pain in the lower back. The pain may also spread to other areas, such as: ? Buttocks. ? Groin. ? Thighs.  Muscle spasms in or around the painful areas.  Increased pain when standing, walking, running, stair climbing, bending, or lifting. How is this diagnosed? This condition is diagnosed with a physical exam and medical history. During the exam, the health care provider may move one or both of your legs to different positions to check for pain. Various tests may be done to confirm the diagnosis, including:  Imaging tests to look for other causes of pain. These may include: ? MRI. ? CT scan. ? Bone scan.  Diagnostic injection. A numbing medicine is injected into the  SI joint using a needle. If your pain is temporarily improved or stopped  after the injection, this can indicate that SI joint dysfunction is the problem. How is this treated? Treatment depends on the cause and severity of your condition. Treatment options may include:  Ice or heat applied to the lower back area after an injury. This may help reduce pain and muscle spasms.  Medicines to relieve pain or inflammation or to relax the muscles.  Wearing a back brace (sacroiliac brace) to help support the joint while your back is healing.  Physical therapy to increase muscle strength around the joint and flexibility at the joint. This may also involve learning proper body positions and ways of moving to relieve stress on the joint.  Direct manipulation of the SI joint.  Injections of steroid medicine into the joint to reduce pain and swelling.  Radiofrequency ablation to burn away nerves that are carrying pain messages from the joint.  Use of a device that provides electrical stimulation to help reduce pain at the joint.  Surgery to put in screws and plates that limit or prevent joint motion. This is rare. Follow these instructions at home: Medicines  Take over-the-counter and prescription medicines only as told by your health care provider.  Do not drive or use heavy machinery while taking prescription pain medicine.  If you are taking prescription pain medicine, take actions to prevent or treat constipation. Your health care provider may recommend that you: ? Drink enough fluid to keep your urine pale yellow. ? Eat foods that are high in fiber, such as fresh fruits and vegetables, whole grains, and beans. ? Limit foods that are high in fat and processed sugars, such as fried or sweet foods. ? Take an over-the-counter or prescription medicine for constipation. If you have a brace:  Wear the brace as told by your health care provider. Remove it only as told by your health care provider.  Keep the brace clean.  If the brace is not waterproof: ? Do not let  it get wet. ? Cover it with a watertight covering when you take a bath or a shower. Managing pain, stiffness, and swelling      Icing can help with pain and swelling. Heat may help with muscle tension or spasms. Ask your health care provider if you should use ice or heat.  If directed, put ice on the affected area: ? If you have a removable brace, remove it as told by your health care provider. ? Put ice in a plastic bag. ? Place a towel between your skin and the bag. ? Leave the ice on for 20 minutes, 2-3 times a day.  If directed, apply heat to the affected area. Use the heat source that your health care provider recommends, such as a moist heat pack or a heating pad. ? Place a towel between your skin and the heat source. ? Leave the heat on for 20-30 minutes. ? Remove the heat if your skin turns bright red. This is especially important if you are unable to feel pain, heat, or cold. You may have a greater risk of getting burned. General instructions  Rest as needed. Ask your health care provider what activities are safe for you.  Return to your normal activities as told by your health care provider.  Exercise as directed by your health care provider or physical therapist.  Do not use any products that contain nicotine or tobacco, such as cigarettes and e-cigarettes. These can delay  bone healing. If you need help quitting, ask your health care provider.  Keep all follow-up visits as told by your health care provider. This is important. Contact a health care provider if:  Your pain is not controlled with medicine.  You have a fever.  Your pain is getting worse. Get help right away if:  You have weakness, numbness, or tingling in your legs or feet.  You lose control of your bladder or bowel. Summary  Sacroiliac joint dysfunction is a condition that causes inflammation on one or both sides of the sacroiliac (SI) joint.  This condition causes deep aching or burning pain in  the low back. In some cases, the pain may also spread into one or both buttocks, hips, or thighs.  Treatment depends on the cause and severity of your condition. It may include medicines to reduce pain and swelling or to relax muscles. This information is not intended to replace advice given to you by your health care provider. Make sure you discuss any questions you have with your health care provider. Document Revised: 12/28/2017 Document Reviewed: 06/13/2017 Elsevier Patient Education  2020 [employer].

## 2020-02-29 ENCOUNTER — Other Ambulatory Visit: Payer: Self-pay | Admitting: Family Medicine

## 2020-02-29 NOTE — Telephone Encounter (Signed)
Left a voice message on pt mobile phone to call the office for an office visit/virtual for his Lexapro refill

## 2020-03-04 ENCOUNTER — Telehealth (INDEPENDENT_AMBULATORY_CARE_PROVIDER_SITE_OTHER): Payer: Managed Care, Other (non HMO) | Admitting: Family Medicine

## 2020-03-04 ENCOUNTER — Encounter: Payer: Self-pay | Admitting: Family Medicine

## 2020-03-04 VITALS — Temp 97.0°F | Ht 70.0 in | Wt 250.0 lb

## 2020-03-04 DIAGNOSIS — R5383 Other fatigue: Secondary | ICD-10-CM | POA: Diagnosis not present

## 2020-03-04 DIAGNOSIS — F4010 Social phobia, unspecified: Secondary | ICD-10-CM

## 2020-03-04 MED ORDER — ESCITALOPRAM OXALATE 10 MG PO TABS: 10.0000 mg | ORAL_TABLET | Freq: Every day | ORAL | 3 refills | Status: DC

## 2020-03-04 NOTE — Progress Notes (Signed)
Patient ID: Donald Pearson, male   DOB: Feb 26, 1976, 44 y.o.   MRN: [id number]  This visit type was conducted due to national recommendations for restrictions regarding the COVID-19 pandemic in an effort to limit this patient's exposure and mitigate transmission in our community.   Virtual Visit via Video Note  I connected with Darcel Smalling on 03/04/20 at 10:15 AM EDT by a video enabled telemedicine application and verified that I am speaking with the correct person using two identifiers.  Location patient: home Location provider:work or home office Persons participating in the virtual visit: patient, provider  I discussed the limitations of evaluation and management by telemedicine and the availability of in person appointments. The patient expressed understanding and agreed to proceed.   HPI: Gleason has longstanding history of anxiety.  Suspected social anxiety disorder.  Takes Lexapro 10 mg daily.  He recently tapered himself off but after being off about 2 weeks had substantial increased anxiety symptoms.  He is now back on 10 mg daily and symptoms are improving.  No depression symptoms currently.  He does have psoriatic arthropathy and plaque psoriasis.  Is followed by rheumatology and treated with [facility].  He has fatigue issues.  Generally gets enough hours sleep but wakes up feeling fatigued.  Has weight gain in the past year.  No regular exercise.  Wife has noted that he snores but no reports of any observed apnea.  He is planning to get lab work through his work soon   ROS: See pertinent positives and negatives per HPI.  Past Medical History:  Diagnosis Date  . Asthma   . Depression   . Psoriatic arthropathy (HCC)     Past Surgical History:  Procedure Laterality Date  . MOLE REMOVAL  2004  . VASECTOMY    . WISDOM TOOTH EXTRACTION      Family History  Problem Relation Age of Onset  . Diabetes Mother        type ll  . Heart disease Maternal Grandmother   . Diabetes  Maternal Grandmother        type ll  . Healthy Brother   . Healthy Son   . Healthy Daughter     SOCIAL HX: History of smoking   Current Outpatient Medications:  .  albuterol (PROVENTIL HFA;VENTOLIN HFA) 108 (90 Base) MCG/ACT inhaler, Inhale 2 puffs into the lungs every 4 (four) hours as needed for wheezing., Disp: 1 Inhaler, Rfl: 1 .  cetirizine (ZYRTEC) 10 MG tablet, Take 10 mg by mouth daily., Disp: , Rfl:  .  escitalopram (LEXAPRO) 10 MG tablet, Take 1 tablet (10 mg total) by mouth daily., Disp: 90 tablet, Rfl: 3 .  Multiple Vitamin (MULTIVITAMIN PO), Take by mouth daily., Disp: , Rfl:  .  naproxen sodium (ALEVE) 220 MG tablet, Take 220 mg by mouth as needed. , Disp: , Rfl:  .  OTEZLA 30 MG TABS, TAKE 1 TABLET (30 MG) BY MOUTH 2 TIMES DAILY., Disp: 60 tablet, Rfl: 2  EXAM:  VITALS per patient if applicable:  GENERAL: alert, oriented, appears well and in no acute distress  HEENT: atraumatic, conjunttiva clear, no obvious abnormalities on inspection of external nose and ears  NECK: normal movements of the head and neck  LUNGS: on inspection no signs of respiratory distress, breathing rate appears normal, no obvious gross SOB, gasping or wheezing  CV: no obvious cyanosis  MS: moves all visible extremities without noticeable abnormality  PSYCH/NEURO: pleasant and cooperative, no obvious depression or anxiety, speech and  thought processing grossly intact  ASSESSMENT AND PLAN:  Discussed the following assessment and plan:  #1 social anxiety disorder stable on Lexapro -Refill Lexapro 10 mg daily for 1 year  #2 Fatigue issues-? OSA.  He plans to get lab work through work soon.  We have encouraged him to make sure his thyroid gets checked.  If this is unrevealing consider work-up for obstructive sleep apnea.     I discussed the assessment and treatment plan with the patient. The patient was provided an opportunity to ask questions and all were answered. The patient agreed  with the plan and demonstrated an understanding of the instructions.   The patient was advised to call back or seek an in-person evaluation if the symptoms worsen or if the condition fails to improve as anticipated.     Evelena Peat, MD

## 2020-03-12 ENCOUNTER — Other Ambulatory Visit: Payer: Self-pay | Admitting: Physician Assistant

## 2020-03-12 ENCOUNTER — Other Ambulatory Visit: Payer: Self-pay | Admitting: Rheumatology

## 2020-03-12 DIAGNOSIS — L400 Psoriasis vulgaris: Secondary | ICD-10-CM

## 2020-03-12 NOTE — Telephone Encounter (Signed)
Last Visit: 02/25/2020 Next Visit: 07/29/2019  Current Dose per office note on 02/25/2020: Otezla 30 mg 1 tablet by mouth twice daily   Okay to refill per Dr. Corliss Skains

## 2020-03-27 MED FILL — OTEZLA 30 MG TABS: 30 | 30 days supply | Qty: 60 | Fill #0

## 2020-05-26 MED FILL — OTEZLA 30 MG TABS: 30 | 30 days supply | Qty: 60 | Fill #1

## 2020-06-23 MED FILL — OTEZLA 30 MG TABS: 30 | 30 days supply | Qty: 60 | Fill #2

## 2020-07-14 ENCOUNTER — Other Ambulatory Visit: Payer: Self-pay | Admitting: Rheumatology

## 2020-07-14 ENCOUNTER — Other Ambulatory Visit: Payer: Self-pay | Admitting: Physician Assistant

## 2020-07-14 DIAGNOSIS — L400 Psoriasis vulgaris: Secondary | ICD-10-CM

## 2020-07-14 NOTE — Telephone Encounter (Signed)
Last Visit: 02/25/2020 Next Visit: 07/28/2020  Current Dose per office note on 02/25/2020, Otezla 30 mg 1 tablet by mouth twice daily  Dx:  Psoriatic arthropathy   Last Fill:03/12/2020  Okay to refill [facility]?

## 2020-07-14 NOTE — Progress Notes (Unsigned)
Office Visit Note  Patient: Donald Pearson             Date of Birth: [birth year]           MRN: [id number]             PCP: Kristian Covey, MD Referring: Kristian Covey, MD Visit Date: 07/28/2020 Occupation: @GUAROCC @  Subjective:  Other (Left elbow pain )   History of Present Illness: Donald Pearson is a 45 y.o. male with history of psoriatic arthritis, psoriasis.  He states that he has been having some discomfort in his left elbow joint for the last 3 months okay.  He has not seen any joint swelling.  He is also experiencing some lower back pain which she describes in the lower part of his lumbar spine.  He has not had any episodes of plantar fasciitis, Achilles tendinitis, dactylitis, iritis or shortness of breath.  He has been taking 45 on a regular basis.  He also takes Naprosyn on a as needed basis.  He has few psoriasis patches which are small.  Mostly on his elbows and his knees.  Activities of Daily Living:  Patient reports morning stiffness for 5-10 minutes.   Patient Reports nocturnal pain.  Difficulty dressing/grooming: Denies Difficulty climbing stairs: Denies Difficulty getting out of chair: Denies Difficulty using hands for taps, buttons, cutlery, and/or writing: Denies  Review of Systems  Constitutional: Negative for fatigue.  HENT: Negative for mouth sores, mouth dryness and nose dryness.   Eyes: Negative for pain, itching and dryness.  Respiratory: Negative for shortness of breath and difficulty breathing.   Cardiovascular: Negative for chest pain and palpitations.  Gastrointestinal: Negative for blood in stool, constipation and diarrhea.  Endocrine: Negative for increased urination.  Genitourinary: Negative for difficulty urinating.  Musculoskeletal: Positive for arthralgias, joint pain and morning stiffness. Negative for joint swelling, myalgias, muscle tenderness and myalgias.  Skin: Positive for rash. Negative for color change and redness.   Allergic/Immunologic: Negative for susceptible to infections.  Neurological: Negative for dizziness, numbness, headaches, memory loss and weakness.  Hematological: Negative for bruising/bleeding tendency.  Psychiatric/Behavioral: Negative for confusion.    PMFS History:  Patient Active Problem List   Diagnosis Date Noted  . Psoriatic arthropathy (HCC) 06/28/2018  . Family history of psoriasis 06/28/2018  . Smoker 06/28/2018  . Sacroiliitis (HCC) 06/28/2018  . Plaque psoriasis 12/02/2016  . Social anxiety disorder 07/15/2011  . History of depression 01/25/2011  . Asthma, mild intermittent 01/25/2011    Past Medical History:  Diagnosis Date  . Asthma   . Depression   . Psoriatic arthropathy (HCC)     Family History  Problem Relation Age of Onset  . Diabetes Mother        type ll  . Heart disease Maternal Grandmother   . Diabetes Maternal Grandmother        type ll  . Healthy Brother   . Healthy Son   . Healthy Daughter    Past Surgical History:  Procedure Laterality Date  . MOLE REMOVAL  2004  . VASECTOMY    . WISDOM TOOTH EXTRACTION     Social History   Social History Narrative  . Not on file   Immunization History  Administered Date(s) Administered  . Influenza,inj,Quad PF,6+ Mos 03/24/2019, 01/29/2020  . Influenza-Unspecified 03/17/2016, 03/08/2017  . PFIZER(Purple Top)SARS-COV-2 Vaccination 08/02/2019, 08/27/2019  . Tdap 06/17/2014     Objective: Vital Signs: BP (!) 158/100 (BP Location: Left Arm, Patient Position: Sitting, Cuff  Size: Normal)   Pulse 80   Resp 16   Ht 5\' 10"  (1.778 m)   Wt 253 lb 12.8 oz (115.1 kg)   BMI 36.42 kg/m    Physical Exam Vitals and nursing note reviewed.  Constitutional:      Appearance: He is well-developed.  HENT:     Head: Normocephalic and atraumatic.  Eyes:     Conjunctiva/sclera: Conjunctivae normal.     Pupils: Pupils are equal, round, and reactive to light.  Cardiovascular:     Rate and Rhythm: Normal  rate and regular rhythm.     Heart sounds: Normal heart sounds.  Pulmonary:     Effort: Pulmonary effort is normal.     Breath sounds: Normal breath sounds.  Abdominal:     General: Bowel sounds are normal.     Palpations: Abdomen is soft.  Musculoskeletal:     Cervical back: Normal range of motion and neck supple.  Skin:    General: Skin is warm and dry.     Capillary Refill: Capillary refill takes less than 2 seconds.     Comments: Psoriasis patches were noted over bilateral elbows and knees.  Neurological:     Mental Status: He is alert and oriented to person, place, and time.  Psychiatric:        Behavior: Behavior normal.      Musculoskeletal Exam: C-spine was in good range of motion.  Thoracic and lumbar spine with good range of motion.  He has discomfort in the lower lumbar region and over the SI joints.  Shoulder joints, elbow joints, wrist joints, MCPs PIPs and DIPs with good range of motion with no synovitis.  He had tenderness on palpation over left lateral epicondyle region consistent with lateral epicondylitis.  Hip joints, knee joints, ankles, MTPs and PIPs with good range of motion with no synovitis.  There was no evidence of dactylitis.  CDAI Exam: CDAI Score: 0.4  Patient Global: 2 mm; Provider Global: 2 mm Swollen: 0 ; Tender: 3  Joint Exam 07/28/2020      Right  Left  Lumbar Spine   Tender     Sacroiliac   Tender   Tender     Investigation: No additional findings.  Imaging: No results found.  Recent Labs: Lab Results  Component Value Date   WBC 7.7 06/16/2018   HGB 16.0 06/16/2018   PLT 194 06/16/2018   NA 140 06/16/2018   K 4.1 06/16/2018   CL 105 06/16/2018   CO2 28 06/16/2018   GLUCOSE 81 06/16/2018   BUN 15 06/16/2018   CREATININE 1.00 06/16/2018   BILITOT 0.7 06/16/2018   ALKPHOS 79 11/01/2017   AST 22 06/16/2018   ALT 31 06/16/2018   PROT 7.0 06/16/2018   PROT 7.0 06/16/2018   ALBUMIN 4.8 11/01/2017   CALCIUM 9.7 06/16/2018    GFRAA 109 06/16/2018   QFTBGOLDPLUS NEGATIVE 06/16/2018   June 25, 2020 uric acid 7.8, CMP normal, CBC normal, triglycerides elevated at 187, UA negative  Speciality Comments: No specialty comments available.  Procedures:  No procedures performed Allergies: Penicillins   Assessment / Plan:     Visit Diagnoses: Psoriatic arthropathy (HCC)-overall his psoriatic arthritis is quite well controlled.  He continues to have some lower back discomfort which could be related to facet joint arthropathy.  He has had no synovitis, plantar fasciitis, Achilles tendinitis.  He had recurrent knee joint effusion in the past which is resolved.  Plaque psoriasis-he has few small patches on  his elbows and knees.  Have advised him to use topical agents.  High risk medication use - Otezla 30 mg 1 tablet by mouth twice daily (started in February 2020).  He also takes Naprosyn on as needed basis.  He brought labs from his PCPs office today which was within normal limits.  Lateral epicondylitis-he has been having discomfort over left lateral epicondyle condylar region.  He had tenderness on palpation.  Different treatment options and their side effects were discussed.  He declined physical therapy.  Have given a handout on exercises.  If he has an adequate response we can consider cortisone injections in the future.  Chronic midline low back pain without sciatica - Plan: XR Lumbar Spine 2-3 Views-he has mild degenerative changes and facet joint arthropathy.  Have given him a handout on core muscle strengthening exercises and back exercises.  Weight loss was also emphasized.  Sacroiliitis (HCC) -he has some SI joint discomfort.  Plan: XR Pelvis 1-2 Views.  X-rays today showed no radiographic progression.  Family history of psoriasis  History of asthma  History of depression  Social anxiety disorder  Smoker - <1pack per week.  Smoking cessation was discussed.  BMI 36.42-weight loss diet and exercise was  emphasized.  His uric acid is mildly elevated.  Dietary modifications were also discussed.  Orders: Orders Placed This Encounter  Procedures  . XR Pelvis 1-2 Views  . XR Lumbar Spine 2-3 Views   No orders of the defined types were placed in this encounter.     Follow-Up Instructions: Return for Psoriatic arthritis.   Pollyann Savoy, MD  Note - This record has been created using Animal nutritionist.  Chart creation errors have been sought, but may not always  have been located. Such creation errors do not reflect on  the standard of medical care.

## 2020-07-16 ENCOUNTER — Other Ambulatory Visit: Payer: Self-pay

## 2020-07-16 ENCOUNTER — Ambulatory Visit: Payer: Managed Care, Other (non HMO) | Admitting: Family Medicine

## 2020-07-16 ENCOUNTER — Encounter: Payer: Self-pay | Admitting: Family Medicine

## 2020-07-16 VITALS — BP 132/82 | HR 56 | Ht 70.0 in | Wt 253.0 lb

## 2020-07-16 DIAGNOSIS — F4010 Social phobia, unspecified: Secondary | ICD-10-CM

## 2020-07-16 DIAGNOSIS — H938X1 Other specified disorders of right ear: Secondary | ICD-10-CM | POA: Diagnosis not present

## 2020-07-16 DIAGNOSIS — R400 Somnolence: Secondary | ICD-10-CM

## 2020-07-16 NOTE — Patient Instructions (Signed)
Sleep Apnea Sleep apnea is a condition in which breathing pauses or becomes shallow during sleep. Episodes of sleep apnea usually last 10 seconds or longer, and they may occur as many as 20 times an hour. Sleep apnea disrupts your sleep and keeps your body from getting the rest that it needs. This condition can increase your risk of certain health problems, including:  Heart attack.  Stroke.  Obesity.  Diabetes.  Heart failure.  Irregular heartbeat. What are the causes? There are three kinds of sleep apnea:  Obstructive sleep apnea. This kind is caused by a blocked or collapsed airway.  Central sleep apnea. This kind happens when the part of the brain that controls breathing does not send the correct signals to the muscles that control breathing.  Mixed sleep apnea. This is a combination of obstructive and central sleep apnea. The most common cause of this condition is a collapsed or blocked airway. An airway can collapse or become blocked if:  Your throat muscles are abnormally relaxed.  Your tongue and tonsils are larger than normal.  You are overweight.  Your airway is smaller than normal.   What increases the risk? You are more likely to develop this condition if you:  Are overweight.  Smoke.  Have a smaller than normal airway.  Are elderly.  Are male.  Drink alcohol.  Take sedatives or tranquilizers.  Have a family history of sleep apnea. What are the signs or symptoms? Symptoms of this condition include:  Trouble staying asleep.  Daytime sleepiness and tiredness.  Irritability.  Loud snoring.  Morning headaches.  Trouble concentrating.  Forgetfulness.  Decreased interest in sex.  Unexplained sleepiness.  Mood swings.  Personality changes.  Feelings of depression.  Waking up often during the night to urinate.  Dry mouth.  Sore throat. How is this diagnosed? This condition may be diagnosed with:  A medical history.  A physical  exam.  A series of tests that are done while you are sleeping (sleep study). These tests are usually done in a sleep lab, but they may also be done at home. How is this treated? Treatment for this condition aims to restore normal breathing and to ease symptoms during sleep. It may involve managing health issues that can affect breathing, such as high blood pressure or obesity. Treatment may include:  Sleeping on your side.  Using a decongestant if you have nasal congestion.  Avoiding the use of depressants, including alcohol, sedatives, and narcotics.  Losing weight if you are overweight.  Making changes to your diet.  Quitting smoking.  Using a device to open your airway while you sleep, such as: ? An oral appliance. This is a custom-made mouthpiece that shifts your lower jaw forward. ? A continuous positive airway pressure (CPAP) device. This device blows air through a mask when you breathe out (exhale). ? A nasal expiratory positive airway pressure (EPAP) device. This device has valves that you put into each nostril. ? A bi-level positive airway pressure (BPAP) device. This device blows air through a mask when you breathe in (inhale) and breathe out (exhale).  Having surgery if other treatments do not work. During surgery, excess tissue is removed to create a wider airway. It is important to get treatment for sleep apnea. Without treatment, this condition can lead to:  High blood pressure.  Coronary artery disease.  In men, an inability to achieve or maintain an erection (impotence).  Reduced thinking abilities.   Follow these instructions at home: Lifestyle    Make any lifestyle changes that your health care provider recommends.  Eat a healthy, well-balanced diet.  Take steps to lose weight if you are overweight.  Avoid using depressants, including alcohol, sedatives, and narcotics.  Do not use any products that contain nicotine or tobacco, such as cigarettes,  e-cigarettes, and chewing tobacco. If you need help quitting, ask your health care provider. General instructions  Take over-the-counter and prescription medicines only as told by your health care provider.  If you were given a device to open your airway while you sleep, use it only as told by your health care provider.  If you are having surgery, make sure to tell your health care provider you have sleep apnea. You may need to bring your device with you.  Keep all follow-up visits as told by your health care provider. This is important. Contact a health care provider if:  The device that you received to open your airway during sleep is uncomfortable or does not seem to be working.  Your symptoms do not improve.  Your symptoms get worse. Get help right away if:  You develop: ? Chest pain. ? Shortness of breath. ? Discomfort in your back, arms, or stomach.  You have: ? Trouble speaking. ? Weakness on one side of your body. ? Drooping in your face. These symptoms may represent a serious problem that is an emergency. Do not wait to see if the symptoms will go away. Get medical help right away. Call your local emergency services (911 in the [place] Do not drive yourself to the hospital. Summary  Sleep apnea is a condition in which breathing pauses or becomes shallow during sleep.  The most common cause is a collapsed or blocked airway.  The goal of treatment is to restore normal breathing and to ease symptoms during sleep. This information is not intended to replace advice given to you by your health care provider. Make sure you discuss any questions you have with your health care provider. Document Revised: 10/18/2018 Document Reviewed: 12/27/2017 Elsevier Patient Education  2021 Elsevier Inc.  

## 2020-07-16 NOTE — Progress Notes (Signed)
Established Patient Office Visit  Subjective:  Patient ID: Donald Pearson, male    DOB: [birth year]  Age: 45 y.o. MRN: [id number]  CC:  Chief Complaint  Patient presents with  . Ear Problem  . Sleeping Problem    HPI Donald Pearson presents for the following issues  He had some right ear congestion.  [place] and folic symptoms are worse there.  He did do some swimming and apparently large amount of wax came out of the right ear afterwards.  Symptoms are somewhat improved.  Hearing intact.  Has had some nasal congestion.  No vertigo.  Bigger concern is frequent daytime somnolence.  He has some fatigue issues.  Does not wakes up feeling rested.  He is concerned specifically about sleep apnea.  Wife states that he snores frequently.  No observed apnea.  He is getting lab work through rheumatology and also gets yearly screening at work.  We have no record of his recent labs.  He has history of psoriatic arthritis and sacroiliitis as well as plaque psoriasis.  Followed by rheumatology for that.  Social anxiety disorder treated with Lexapro and stable  Past Medical History:  Diagnosis Date  . Asthma   . Depression   . Psoriatic arthropathy (HCC)     Past Surgical History:  Procedure Laterality Date  . MOLE REMOVAL  2004  . VASECTOMY    . WISDOM TOOTH EXTRACTION      Family History  Problem Relation Age of Onset  . Diabetes Mother        type ll  . Heart disease Maternal Grandmother   . Diabetes Maternal Grandmother        type ll  . Healthy Brother   . Healthy Son   . Healthy Daughter     Social History   Socioeconomic History  . Marital status: Married    Spouse name: Not on file  . Number of children: Not on file  . Years of education: Not on file  . Highest education level: Not on file  Occupational History  . Not on file  Tobacco Use  . Smoking status: Current Every Day Smoker    Packs/day: 0.30    Years: 6.00     Pack years: 1.80    Types: Cigarettes  . Smokeless tobacco: Never Used  Vaping Use  . Vaping Use: Former  Substance and Sexual Activity  . Alcohol use: Yes    Alcohol/week: 7.0 standard drinks    Types: 7 Shots of liquor per week    Comment: daily   . Drug use: No  . Sexual activity: Not on file  Other Topics Concern  . Not on file  Social History Narrative  . Not on file   Social Determinants of Health   Financial Resource Strain: Not on file  Food Insecurity: Not on file  Transportation Needs: Not on file  Physical Activity: Not on file  Stress: Not on file  Social Connections: Not on file  Intimate Partner Violence: Not on file    Outpatient Medications Prior to Visit  Medication Sig Dispense Refill  . albuterol (PROVENTIL HFA;VENTOLIN HFA) 108 (90 Base) MCG/ACT inhaler Inhale 2 puffs into the lungs every 4 (four) hours as needed for wheezing. 1 Inhaler 1  . cetirizine (ZYRTEC) 10 MG tablet Take 10 mg by mouth daily.    Marland Kitchen escitalopram (LEXAPRO) 10 MG tablet Take 1 tablet (10 mg total) by mouth daily. 90 tablet  3  . Multiple Vitamin (MULTIVITAMIN PO) Take by mouth daily.    . naproxen sodium (ALEVE) 220 MG tablet Take 220 mg by mouth as needed.     Marland Kitchen OTEZLA 30 MG TABS TAKE 1 TABLET BY MOUTH 2 TIMES DAILY. 60 tablet 2   No facility-administered medications prior to visit.    Allergies  Allergen Reactions  . Penicillins     As a child, rash    ROS Review of Systems  Constitutional: Positive for fatigue. Negative for chills and fever.  HENT: Positive for congestion. Negative for hearing loss.   Respiratory: Negative for cough and shortness of breath.   Cardiovascular: Negative for chest pain.  Neurological: Negative for headaches.  Psychiatric/Behavioral: Positive for sleep disturbance.      Objective:    Physical Exam Vitals reviewed.  Constitutional:      Appearance: Normal appearance.  HENT:     Right Ear: Tympanic membrane normal.     Left Ear:  Tympanic membrane normal.     Ears:     Comments: Minimal cerumen right canal    Mouth/Throat:     Mouth: Mucous membranes are moist.     Pharynx: Oropharynx is clear. No oropharyngeal exudate or posterior oropharyngeal erythema.  Cardiovascular:     Rate and Rhythm: Normal rate and regular rhythm.  Pulmonary:     Effort: Pulmonary effort is normal.     Breath sounds: Normal breath sounds.  Musculoskeletal:     Cervical back: Neck supple.  Lymphadenopathy:     Cervical: No cervical adenopathy.  Neurological:     Mental Status: He is alert.     BP 132/82   Pulse (!) 56   Ht 5\' 10"  (1.778 m)   Wt 253 lb (114.8 kg)   SpO2 96%   BMI 36.30 kg/m  Wt Readings from Last 3 Encounters:  07/16/20 253 lb (114.8 kg)  03/04/20 250 lb (113.4 kg)  02/25/20 251 lb (113.9 kg)     Health Maintenance Due  Topic Date Due  . COVID-19 Vaccine (3 - Booster for Pfizer series) 02/26/2020    There are no preventive care reminders to display for this patient.  Lab Results  Component Value Date   TSH 1.19 11/01/2017   Lab Results  Component Value Date   WBC 7.7 06/16/2018   HGB 16.0 06/16/2018   HCT 45.8 06/16/2018   MCV 92.5 06/16/2018   PLT 194 06/16/2018   Lab Results  Component Value Date   NA 140 06/16/2018   K 4.1 06/16/2018   CO2 28 06/16/2018   GLUCOSE 81 06/16/2018   BUN 15 06/16/2018   CREATININE 1.00 06/16/2018   BILITOT 0.7 06/16/2018   ALKPHOS 79 11/01/2017   AST 22 06/16/2018   ALT 31 06/16/2018   PROT 7.0 06/16/2018   PROT 7.0 06/16/2018   ALBUMIN 4.8 11/01/2017   CALCIUM 9.7 06/16/2018   GFR 87.15 11/01/2017   No results found for: CHOL No results found for: HDL No results found for: LDLCALC No results found for: TRIG No results found for: CHOLHDL No results found for: 11/03/2017    Assessment & Plan:   #1 increased fatigue and daytime somnolence.  Epworth sleepiness scale score of 15.  Fairly strong suspicion for obstructive sleep apnea. -Set up  referral for further evaluation -Have stressed the importance of weight loss and is currently starting a program of dietary changes to address that  #2 right ear congestion.  Probably related to cerumen.  No major obstruction at this time.  We did suggest some Flonase for his nasal congestive symptoms.  No evidence for otitis media  #3 social anxiety disorder stable on Lexapro -Continue Lexapro 10 mg once daily  No orders of the defined types were placed in this encounter.   Follow-up: No follow-ups on file.    Evelena Peat, MD

## 2020-07-22 MED FILL — OTEZLA 30 MG TABS: 30 | 30 days supply | Qty: 60 | Fill #0

## 2020-07-28 ENCOUNTER — Ambulatory Visit: Payer: Self-pay

## 2020-07-28 ENCOUNTER — Ambulatory Visit: Payer: Managed Care, Other (non HMO) | Admitting: Rheumatology

## 2020-07-28 ENCOUNTER — Encounter: Payer: Self-pay | Admitting: Rheumatology

## 2020-07-28 ENCOUNTER — Other Ambulatory Visit: Payer: Self-pay

## 2020-07-28 VITALS — BP 158/100 | HR 80 | Resp 16 | Ht 70.0 in | Wt 253.8 lb

## 2020-07-28 DIAGNOSIS — M461 Sacroiliitis, not elsewhere classified: Secondary | ICD-10-CM

## 2020-07-28 DIAGNOSIS — G8929 Other chronic pain: Secondary | ICD-10-CM

## 2020-07-28 DIAGNOSIS — F4010 Social phobia, unspecified: Secondary | ICD-10-CM

## 2020-07-28 DIAGNOSIS — L4050 Arthropathic psoriasis, unspecified: Secondary | ICD-10-CM

## 2020-07-28 DIAGNOSIS — F17200 Nicotine dependence, unspecified, uncomplicated: Secondary | ICD-10-CM

## 2020-07-28 DIAGNOSIS — Z840 Family history of diseases of the skin and subcutaneous tissue: Secondary | ICD-10-CM

## 2020-07-28 DIAGNOSIS — M7712 Lateral epicondylitis, left elbow: Secondary | ICD-10-CM | POA: Diagnosis not present

## 2020-07-28 DIAGNOSIS — Z6836 Body mass index (BMI) 36.0-36.9, adult: Secondary | ICD-10-CM

## 2020-07-28 DIAGNOSIS — Z8659 Personal history of other mental and behavioral disorders: Secondary | ICD-10-CM

## 2020-07-28 DIAGNOSIS — L400 Psoriasis vulgaris: Secondary | ICD-10-CM | POA: Diagnosis not present

## 2020-07-28 DIAGNOSIS — Z79899 Other long term (current) drug therapy: Secondary | ICD-10-CM

## 2020-07-28 DIAGNOSIS — Z8709 Personal history of other diseases of the respiratory system: Secondary | ICD-10-CM

## 2020-07-28 DIAGNOSIS — M25461 Effusion, right knee: Secondary | ICD-10-CM

## 2020-07-28 DIAGNOSIS — M545 Low back pain, unspecified: Secondary | ICD-10-CM

## 2020-07-28 NOTE — Patient Instructions (Signed)
Back Exercises The following exercises strengthen the muscles that help to support the trunk and back. They also help to keep the lower back flexible. Doing these exercises can help to prevent back pain or lessen existing pain.  If you have back pain or discomfort, try doing these exercises 2-3 times each day or as told by your health care provider.  As your pain improves, do them once each day, but increase the number of times that you repeat the steps for each exercise (do more repetitions).  To prevent the recurrence of back pain, continue to do these exercises once each day or as told by your health care provider. Do exercises exactly as told by your health care provider and adjust them as directed. It is normal to feel mild stretching, pulling, tightness, or discomfort as you do these exercises, but you should stop right away if you feel sudden pain or your pain gets worse. Exercises Single knee to chest Repeat these steps 3-5 times for each leg: 1. Lie on your back on a firm bed or the floor with your legs extended. 2. Bring one knee to your chest. Your other leg should stay extended and in contact with the floor. 3. Hold your knee in place by grabbing your knee or thigh with both hands and hold. 4. Pull on your knee until you feel a gentle stretch in your lower back or buttocks. 5. Hold the stretch for 10-30 seconds. 6. Slowly release and straighten your leg. Pelvic tilt Repeat these steps 5-10 times: 1. Lie on your back on a firm bed or the floor with your legs extended. 2. Bend your knees so they are pointing toward the ceiling and your feet are flat on the floor. 3. Tighten your lower abdominal muscles to press your lower back against the floor. This motion will tilt your pelvis so your tailbone points up toward the ceiling instead of pointing to your feet or the floor. 4. With gentle tension and even breathing, hold this position for 5-10 seconds. Cat-cow Repeat these steps until  your lower back becomes more flexible: 1. Get into a hands-and-knees position on a firm surface. Keep your hands under your shoulders, and keep your knees under your hips. You may place padding under your knees for comfort. 2. Let your head hang down toward your chest. Contract your abdominal muscles and point your tailbone toward the floor so your lower back becomes rounded like the back of a cat. 3. Hold this position for 5 seconds. 4. Slowly lift your head, let your abdominal muscles relax and point your tailbone up toward the ceiling so your back forms a sagging arch like the back of a cow. 5. Hold this position for 5 seconds.   Press-ups Repeat these steps 5-10 times: 1. Lie on your abdomen (face-down) on the floor. 2. Place your palms near your head, about shoulder-width apart. 3. Keeping your back as relaxed as possible and keeping your hips on the floor, slowly straighten your arms to raise the top half of your body and lift your shoulders. Do not use your back muscles to raise your upper torso. You may adjust the placement of your hands to make yourself more comfortable. 4. Hold this position for 5 seconds while you keep your back relaxed. 5. Slowly return to lying flat on the floor.   Bridges Repeat these steps 10 times: 1. Lie on your back on a firm surface. 2. Bend your knees so they are pointing toward the   ceiling and your feet are flat on the floor. Your arms should be flat at your sides, next to your body. 3. Tighten your buttocks muscles and lift your buttocks off the floor until your waist is at almost the same height as your knees. You should feel the muscles working in your buttocks and the back of your thighs. If you do not feel these muscles, slide your feet 1-2 inches farther away from your buttocks. 4. Hold this position for 3-5 seconds. 5. Slowly lower your hips to the starting position, and allow your buttocks muscles to relax completely. If this exercise is too easy, try  doing it with your arms crossed over your chest.   Abdominal crunches Repeat these steps 5-10 times: 1. Lie on your back on a firm bed or the floor with your legs extended. 2. Bend your knees so they are pointing toward the ceiling and your feet are flat on the floor. 3. Cross your arms over your chest. 4. Tip your chin slightly toward your chest without bending your neck. 5. Tighten your abdominal muscles and slowly raise your trunk (torso) high enough to lift your shoulder blades a tiny bit off the floor. Avoid raising your torso higher than that because it can put too much stress on your low back and does not help to strengthen your abdominal muscles. 6. Slowly return to your starting position. Back lifts Repeat these steps 5-10 times: 1. Lie on your abdomen (face-down) with your arms at your sides, and rest your forehead on the floor. 2. Tighten the muscles in your legs and your buttocks. 3. Slowly lift your chest off the floor while you keep your hips pressed to the floor. Keep the back of your head in line with the curve in your back. Your eyes should be looking at the floor. 4. Hold this position for 3-5 seconds. 5. Slowly return to your starting position. Contact a health care provider if:  Your back pain or discomfort gets much worse when you do an exercise.  Your worsening back pain or discomfort does not lessen within 2 hours after you exercise. If you have any of these problems, stop doing these exercises right away. Do not do them again unless your health care provider says that you can. Get help right away if:  You develop sudden, severe back pain. If this happens, stop doing the exercises right away. Do not do them again unless your health care provider says that you can. This information is not intended to replace advice given to you by your health care provider. Make sure you discuss any questions you have with your health care provider. Document Revised: 09/07/2018  Document Reviewed: 02/02/2018 Elsevier Patient Education  2021 [employer] Elbow and Forearm Exercises Ask your health care provider which exercises are safe for you. Do exercises exactly as told by your health care provider and adjust them as directed. It is normal to feel mild stretching, pulling, tightness, or discomfort as you do these exercises. Stop right away if you feel sudden pain or your pain gets worse. Do not begin these exercises until told by your health care provider. Range-of-motion exercises These exercises warm up your muscles and joints and improve the movement and flexibility of your injured elbow and forearm. The exercises also help to relieve pain, numbness, and tingling. These exercises are done using the muscles in your injured elbow and forearm (active). Elbow flexion, active 1. Hold your left / right arm at your side, and  bend your elbow (flexion) as far as you can using only your arm muscles. 2. Hold this position for __________ seconds. 3. Slowly return to the starting position. Repeat __________ times. Complete this exercise __________ times a day. Elbow extension, active 1. Hold your left / right arm at your side, and straighten your elbow (extension) as much as you can using only your arm muscles. 2. Hold this position for __________ seconds. 3. Slowly return to the starting position. Repeat __________ times. Complete this exercise __________ times a day. Active forearm rotation, supination This is an exercise in which you turn (rotate) your forearm palm up (supination). 1. Stand or sit with your elbows at your sides. 2. Bend your left / right elbow to a 90-degree angle (right angle). 3. Rotate your palm up until you feel a gentle stretch on the inside of your forearm. 4. Hold this position for __________ seconds. 5. Slowly return to the starting position. Repeat __________ times. Complete this exercise __________ times a day. Active forearm rotation,  pronation This is an exercise in which you turn (rotate) your forearm palm down (pronation). 1. Stand or sit with your elbows at your sides. 2. Bend your left / right elbow to a 90-degree angle (right angle). 3. Rotate your palm down until you feel a gentle stretch on the top of your forearm. 4. Hold this position for __________ seconds. 5. Slowly return to the starting position. Repeat __________ times. Complete this exercise __________ times a day. Stretching exercises These exercises warm up your muscles and joints and improve the movement and flexibility of your injured elbow and forearm. These exercises also help to relieve pain, numbness, and tingling. These exercises are done using your healthy elbow and forearm to help stretch the muscles in your injured elbow and forearm (active-assisted). Elbow flexion, active-assisted 1. Hold your left / right arm at your side, and bend your elbow (flexion) as much as you can using your left / right arm muscles. 2. Use your other hand to bend your left / right elbow farther. To do this, gently push up on your forearm until you feel a gentle stretch on the back of your elbow. 3. Hold this position for __________ seconds. 4. Slowly return to the starting position. Repeat __________ times. Complete this exercise __________ times a day.   Elbow extension, active-assisted 1. Hold your left / right arm at your side, and straighten your elbow (extension) as much as you can using your left / right arm muscles. 2. Use your other hand to straighten the left / right elbow farther. To do this, gently push down on your forearm until you feel a gentle stretch on the inside of your elbow. 3. Hold this position for __________ seconds. 4. Slowly return to the starting position. Repeat __________ times. Complete this exercise __________ times a day.   Active-assisted forearm rotation, supination This is an exercise in which you turn (rotate) your forearm palm up  (supination). 1. Sit with your left / right elbow bent in a 90-degree angle (right angle) with your forearm resting on a table. 2. Keeping your upper body and shoulder still, rotate your forearm so your palm faces upward. 3. Use your other hand to help rotate your forearm further until you feel a gentle to moderate stretch. 4. Hold this position for __________ seconds. 5. Slowly release the stretch and return to the starting position. Repeat __________ times. Complete this exercise __________ times a day. Active-assisted forearm rotation, pronation This is an  exercise in which you turn (rotate) your forearm palm down (pronation). 1. Sit with your left / right elbow bent in a 90-degree angle (right angle) with your forearm resting on a table. 2. Keeping your upper body and shoulder still, rotate your forearm so your palm faces the tabletop. 3. Use your other hand to help rotate your forearm further until you feel a gentle to moderate stretch. 4. Hold this position for __________ seconds. 5. Slowly release the stretch and return to the starting position. Repeat __________ times. Complete this exercise __________ times a day.   Passive elbow flexion, supine 1. Lie on your back (supine position). 2. Extend your left / right arm up in the air, bracing it with your other hand. 3. Let your left / right hand slowly lower toward your shoulder (passive flexion), while your elbow stays pointed toward the ceiling. You should feel a gentle stretch along the back of your upper arm and elbow. 4. If instructed by your health care provider, you may increase the intensity of your stretch by adding a small wrist weight or hand weight. 5. Hold this position for __________ seconds. 6. Slowly return to the starting position. Repeat __________ times. Complete this exercise __________ times a day. Passive elbow extension, supine 1. Lie on your back (supine position). Make sure that you are in a comfortable position  that lets you relax your arm muscles. 2. Place a folded towel under your left / right upper arm so your elbow and shoulder are at the same height. Straighten your left / right arm so your elbow does not rest on the bed or towel. 3. Let the weight of your hand stretch your elbow (passive extension). Keep your arm and chest muscles relaxed. You should feel a stretch on the inside of your elbow. 4. If told by your health care provider, you may increase the intensity of your stretch by adding a small wrist weight or hand weight. 5. Hold this position for __________ seconds. 6. Slowly release the stretch. Repeat __________ times. Complete this exercise __________ times a day.   Strengthening exercises These exercises build strength and endurance in your elbow and forearm. Endurance is the ability to use your muscles for a long time, even after they get tired. Elbow flexion, isometric 1. Stand or sit up straight. 2. Bend your left / right elbow in a 90-degree angle (right angle), and keep your forearm at the height of your waist. Your thumb should be pointed toward the ceiling (neutral forearm). 3. Place your other hand on top of your left / right forearm. Gently push down while you resist with your left / right arm (isometric flexion). Push as hard as you can with both arms without causing any pain or movement at your left / right elbow. 4. Hold this position for __________ seconds. 5. Slowly release the tension in both arms. Let your muscles relax completely before you repeat the exercise. Repeat __________ times. Complete this exercise __________ times a day.   Elbow extension, isometric 1. Stand or sit up straight. 2. Place your left / right arm so your palm faces your abdomen and is at the height of your waist. 3. Place your other hand on the underside of your left / right forearm. Gently push up while you resist with your left / right arm (isometric extension). Push as hard as you can with both  arms without causing any pain or movement at your left / right elbow. 4. Hold this position  for __________ seconds. 5. Slowly release the tension in both arms. Let your muscles relax completely before you repeat the exercise. Repeat __________ times. Complete this exercise __________ times a day.   Elbow flexion with forearm palm up 1. Sit on a firm chair without armrests, or stand up. 2. Place your left / right arm at your side with your elbow straight and your palm facing forward. 3. Holding a __________weight or gripping a rubber exercise band or tubing, bend your elbow to bring your hand toward your shoulder (flexion). 4. Hold this position for __________ seconds. 5. Slowly return to the starting position. Repeat __________ times. Complete this exercise __________ times a day.   Elbow extension, active 1. Sit on a firm chair without armrests, or stand up. 2. Hold a rubber exercise band or tubing in both hands. 3. Keeping your upper arms at your sides, bring both hands up to your left / right shoulder. Keep your left / right hand just below your other hand. 4. Straighten your left / right elbow (extension) while keeping your other arm still. 5. Hold this position for __________ seconds. 6. Control the resistance of the band or tubing as you return to the starting position. Repeat __________ times. Complete this exercise __________ times a day.   Forearm rotation, supination 1. Sit with your left / right forearm supported on a table. Your elbow should be at waist height and bent at a 90-degree angle (right angle). 2. Gently grasp a lightweight hammer. 3. Rest your hand over the edge of the table with your palm facing down. 4. Without moving your left / right elbow, slowly rotate your forearm to turn your palm up toward the ceiling (supination). 5. Hold this position for __________ seconds. 6. Slowly return to the starting position. Repeat __________ times. Complete this exercise  __________ times a day.   Forearm rotation, pronation 1. Sit with your left / right forearm supported on a table. Keep your elbow below shoulder height. 2. Gently grasp a lightweight hammer. 3. Rest your hand over the edge of the table with your palm facing up. 4. Without moving your left / right elbow, slowly rotate your forearm to turn your palm down toward the floor (pronation). 5. Hold this position for __________seconds. 6. Slowly return to the starting position. Repeat __________ times. Complete this exercise __________ times a day.   This information is not intended to replace advice given to you by your health care provider. Make sure you discuss any questions you have with your health care provider. Document Revised: 08/24/2018 Document Reviewed: 05/24/2018 Elsevier Patient Education  2021 [employer].

## 2020-08-04 ENCOUNTER — Other Ambulatory Visit (HOSPITAL_COMMUNITY): Payer: Self-pay

## 2020-08-04 ENCOUNTER — Other Ambulatory Visit (HOSPITAL_BASED_OUTPATIENT_CLINIC_OR_DEPARTMENT_OTHER): Payer: Self-pay

## 2020-08-07 ENCOUNTER — Other Ambulatory Visit (HOSPITAL_COMMUNITY): Payer: Self-pay

## 2020-08-19 ENCOUNTER — Ambulatory Visit (INDEPENDENT_AMBULATORY_CARE_PROVIDER_SITE_OTHER): Payer: Managed Care, Other (non HMO) | Admitting: Pulmonary Disease

## 2020-08-19 ENCOUNTER — Encounter: Payer: Self-pay | Admitting: Pulmonary Disease

## 2020-08-19 ENCOUNTER — Other Ambulatory Visit: Payer: Self-pay

## 2020-08-19 VITALS — BP 146/88 | HR 93 | Temp 97.9°F | Ht 70.0 in | Wt 251.6 lb

## 2020-08-19 DIAGNOSIS — R0683 Snoring: Secondary | ICD-10-CM | POA: Diagnosis not present

## 2020-08-19 NOTE — Progress Notes (Signed)
[facility]  Chief Complaint  Patient presents with  . Consult    Sleep consult    Constitutional:  BP (!) 146/88 (BP Location: Left Arm, Cuff Size: Normal)   Pulse 93   Temp 97.9 F (36.6 C) (Temporal)   Ht 5\' 10"  (1.778 m)   Wt 251 lb 9.6 oz (114.1 kg)   SpO2 96% Comment: Room air  BMI 36.10 kg/m   Past Medical History:  Asthma, Depression, Psoriatic arthritis  Past Surgical History:  He  has a past surgical history that includes Mole removal (2004); Wisdom tooth extraction; and Vasectomy.  Brief Summary:  Donald Pearson is a 45 y.o. male smoker with snoring.       Subjective:   He has snored for years.  He went on trip with family to [place] recently and his kids stayed in same room with him.  They woke him up in the middle of the night because of his snoring, and strange noises with his breathing.  He will wake up with a snort, and has trouble sleeping on his back.  He can fall asleep watching a movie, and has trouble staying focused when working on a computer.    He goes to sleep at 11 pm.  He falls asleep in few minutes.  He wakes up some times to use the bathroom.  He gets out of bed at 630 am.  He feels tired in the morning.  He gets headaches in the morning.  He does not use anything to help him fall sleep.  He drinks coffee during the day to help stay awake.  He denies sleep walking, sleep talking, bruxism, or nightmares.  There is no history of restless legs.  He denies sleep hallucinations, sleep paralysis, or cataplexy.  The Epworth score is 15 out of 24.    Physical Exam:   Appearance - well kempt   ENMT - no sinus tenderness, no oral exudate, no LAN, Mallampati 3 airway, no stridor  Respiratory - equal breath sounds bilaterally, no wheezing or rales  CV - s1s2 regular rate and rhythm, no murmurs  Ext - no clubbing, no edema  Skin - no rashes  Psych - normal mood and affect   Sleep Tests:     Social History:  He  reports that he has been smoking cigarettes. He started smoking about 16 years ago. He has been smoking about 0.30 packs per day. He has never used smokeless tobacco. He reports current alcohol use of about 7.0 standard drinks of alcohol per week. He reports that he does not use drugs.  Family History:  His family history includes Diabetes in his maternal grandmother and mother; Healthy in his brother, daughter, and son; Heart disease in his maternal grandmother.    Discussion:  He has snoring, sleep disruption, apnea, and daytime sleepiness.  He has history of depression.  His BMI is > 35.  I am concerned he could have obstructive sleep apnea.  Assessment/Plan:   Snoring with excessive daytime sleepiness. - will need to arrange for a home sleep study  Tobacco abuse. - reviewed options to help with smoking cessation  Obesity. - discussed how weight can impact sleep and risk for sleep disordered breathing - discussed options to assist with weight loss: combination of diet modification, cardiovascular and strength training exercises  Cardiovascular risk. - had an extensive discussion regarding the adverse health consequences related to untreated sleep disordered breathing - specifically discussed the risks  for hypertension, coronary artery disease, cardiac dysrhythmias, cerebrovascular disease, and diabetes - lifestyle modification discussed  Safe driving practices. - discussed how sleep disruption can increase risk of accidents, particularly when driving - safe driving practices were discussed  Therapies for obstructive sleep apnea. - if the sleep study shows significant sleep apnea, then various therapies for treatment were reviewed: CPAP, oral appliance, and surgical interventions  Time Spent Involved in Patient Care on Day of Examination:  32 minutes  Follow up:  Patient Instructions  Will arrange for home sleep study Will call to arrange for  follow up after sleep study reviewed    Medication List:   Allergies as of 08/19/2020      Reactions   Penicillins    As a child, rash      Medication List       Accurate as of August 19, 2020 12:36 PM. If you have any questions, ask your nurse or doctor.        albuterol 108 (90 Base) MCG/ACT inhaler Commonly known as: VENTOLIN HFA Inhale 2 puffs into the lungs every 4 (four) hours as needed for wheezing.   cetirizine 10 MG tablet Commonly known as: ZYRTEC Take 10 mg by mouth daily.   escitalopram 10 MG tablet Commonly known as: LEXAPRO Take 1 tablet (10 mg total) by mouth daily.   fluticasone 50 MCG/ACT nasal spray Commonly known as: FLONASE Place 1 spray into both nostrils daily.   MULTIVITAMIN PO Take by mouth daily.   naproxen sodium 220 MG tablet Commonly known as: ALEVE Take 220 mg by mouth as needed.   Otezla 30 MG Tabs Generic drug: Apremilast TAKE 1 TABLET BY MOUTH 2 TIMES DAILY.       Signature:  Coralyn Helling, MD [facility] Pager - [phone] 08/19/2020, 12:36 PM

## 2020-08-19 NOTE — Patient Instructions (Signed)
Will arrange for home sleep study Will call to arrange for follow up after sleep study reviewed  

## 2020-09-09 ENCOUNTER — Ambulatory Visit: Payer: Managed Care, Other (non HMO)

## 2020-09-09 ENCOUNTER — Other Ambulatory Visit: Payer: Self-pay

## 2020-09-09 DIAGNOSIS — G4733 Obstructive sleep apnea (adult) (pediatric): Secondary | ICD-10-CM | POA: Diagnosis not present

## 2020-09-09 DIAGNOSIS — R0683 Snoring: Secondary | ICD-10-CM

## 2020-09-10 ENCOUNTER — Telehealth: Payer: Self-pay | Admitting: Pulmonary Disease

## 2020-09-10 ENCOUNTER — Other Ambulatory Visit (HOSPITAL_COMMUNITY): Payer: Self-pay

## 2020-09-10 DIAGNOSIS — G4733 Obstructive sleep apnea (adult) (pediatric): Secondary | ICD-10-CM | POA: Diagnosis not present

## 2020-09-10 NOTE — Telephone Encounter (Signed)
HST 09/09/20 >> AHI 6.7, SpO2 low 89%   Please inform him that his sleep study shows mild obstructive sleep apnea.  Please arrange for ROV with me or NP to discuss treatment options.

## 2020-09-11 NOTE — Telephone Encounter (Signed)
Patient returned phone call, name and birth date confirmed. Went over [employer] results per Dr Craige Cotta with patient. All questions answered and patient expressed full understanding. Scheduled office visit with Dr Craige Cotta on Monday 09/15/2020 at 11:30am at the [facility] office. Patient agreeable to date, time and location. Nothing further needed at this time.

## 2020-09-11 NOTE — Telephone Encounter (Signed)
Called and left message on voicemail to please return phone call to go over HST results. Contact number provided.  

## 2020-09-12 ENCOUNTER — Other Ambulatory Visit (HOSPITAL_COMMUNITY): Payer: Self-pay

## 2020-09-15 ENCOUNTER — Encounter: Payer: Self-pay | Admitting: Pulmonary Disease

## 2020-09-15 ENCOUNTER — Other Ambulatory Visit: Payer: Self-pay

## 2020-09-15 ENCOUNTER — Ambulatory Visit: Payer: Managed Care, Other (non HMO) | Admitting: Pulmonary Disease

## 2020-09-15 ENCOUNTER — Other Ambulatory Visit (HOSPITAL_COMMUNITY): Payer: Self-pay

## 2020-09-15 VITALS — BP 146/92 | HR 95 | Temp 97.8°F | Ht 70.0 in | Wt 248.2 lb

## 2020-09-15 DIAGNOSIS — G4733 Obstructive sleep apnea (adult) (pediatric): Secondary | ICD-10-CM

## 2020-09-15 DIAGNOSIS — E669 Obesity, unspecified: Secondary | ICD-10-CM

## 2020-09-15 DIAGNOSIS — Z7189 Other specified counseling: Secondary | ICD-10-CM

## 2020-09-15 DIAGNOSIS — G4730 Sleep apnea, unspecified: Secondary | ICD-10-CM | POA: Diagnosis not present

## 2020-09-15 NOTE — Progress Notes (Signed)
[name]  Chief Complaint  Patient presents with  . Follow-up    No complaints currently, here to go over HST results    Constitutional:  BP (!) 146/92 (BP Location: Right Arm, Cuff Size: Normal)   Pulse 95   Temp 97.8 F (36.6 C) (Temporal)   Ht 5\' 10"  (1.778 m)   Wt 248 lb 3.2 oz (112.6 kg)   SpO2 96% Comment: Room air  BMI 35.61 kg/m   Past Medical History:  Asthma, Depression, Psoriatic arthritis  Past Surgical History:  He  has a past surgical history that includes Mole removal (2004); Wisdom tooth extraction; and Vasectomy.  Brief Summary:  Donald Pearson is a 45 y.o. male smoker with obstructive sleep apnea.      Subjective:   He had home sleep study.  This showed mild sleep apnea.  He continues to have snoring, sleep disruption, and daytime sleepiness.   Physical Exam:   Appearance - well kempt   ENMT - no sinus tenderness, no oral exudate, no LAN, Mallampati 3 airway, no stridor  Respiratory - equal breath sounds bilaterally, no wheezing or rales  CV - s1s2 regular rate and rhythm, no murmurs  Ext - no clubbing, no edema  Skin - no rashes  Psych - normal mood and affect   Sleep Tests:   HST 09/09/20 >> AHI 6.7, SpO2 low 89%  Social History:  He  reports that he has been smoking cigarettes. He started smoking about 16 years ago. He has been smoking about 0.30 packs per day. He has never used smokeless tobacco. He reports current alcohol use of about 7.0 standard drinks of alcohol per week. He reports that he does not use drugs.  Family History:  His family history includes Diabetes in his maternal grandmother and mother; Healthy in his brother, daughter, and son; Heart disease in his maternal grandmother.     Assessment/Plan:   Obstructive sleep apnea. - reviewed his sleep study results - discussed how untreated sleep apnea can impact his health - treatment options reviewed - he will try to  modify his diet and exercise more to see if weight loss can help - will arrange for auto CPAP set up - he will check with his dentist about whether an oral appliance could be an option for him  Tobacco abuse. - will revisit smoking cessation at his next follow up  Obesity. - discussed how weight can impact sleep and risk for sleep disordered breathing - discussed options to assist with weight loss: combination of diet modification, cardiovascular and strength training exercises   Time Spent Involved in Patient Care on Day of Examination:  24 minutes  Follow up:  Patient Instructions  Will arrange for auto CPAP set up  Follow up in 4 to 5 months   Medication List:   Allergies as of 09/15/2020      Reactions   Penicillins    As a child, rash      Medication List       Accurate as of Sep 15, 2020 12:42 PM. If you have any questions, ask your nurse or doctor.        albuterol 108 (90 Base) MCG/ACT inhaler Commonly known as: VENTOLIN HFA Inhale 2 puffs into the lungs every 4 (four) hours as needed for wheezing.   cetirizine 10 MG tablet Commonly known as: ZYRTEC Take 10 mg by mouth daily.   escitalopram 10 MG tablet Commonly known as: LEXAPRO  Take 1 tablet (10 mg total) by mouth daily.   fluticasone 50 MCG/ACT nasal spray Commonly known as: FLONASE Place 1 spray into both nostrils daily.   MULTIVITAMIN PO Take by mouth daily.   naproxen sodium 220 MG tablet Commonly known as: ALEVE Take 220 mg by mouth as needed.   Otezla 30 MG Tabs Generic drug: Apremilast TAKE 1 TABLET BY MOUTH 2 TIMES DAILY.       Signature:  Coralyn Helling, MD [facility] Pulmonary/Critical Care Pager - [phone] 09/15/2020, 12:42 PM

## 2020-09-15 NOTE — Patient Instructions (Signed)
Will arrange for auto CPAP set up ? ?Follow up in 4 to 5 months ?

## 2020-10-06 ENCOUNTER — Other Ambulatory Visit (HOSPITAL_COMMUNITY): Payer: Self-pay

## 2020-10-06 MED FILL — Apremilast Tab 30 MG: ORAL | 30 days supply | Qty: 60 | Fill #0 | Status: AC

## 2020-10-23 ENCOUNTER — Telehealth: Payer: Self-pay | Admitting: Family Medicine

## 2020-10-23 DIAGNOSIS — J0100 Acute maxillary sinusitis, unspecified: Secondary | ICD-10-CM

## 2020-10-23 MED ORDER — ALBUTEROL SULFATE HFA 108 (90 BASE) MCG/ACT IN AERS
2.0000 | INHALATION_SPRAY | RESPIRATORY_TRACT | 1 refills | Status: DC | PRN
Start: 2020-10-23 — End: 2021-05-13

## 2020-10-23 NOTE — Addendum Note (Signed)
Addended by: Kern Reap B on: 10/23/2020 03:21 PM   Modules accepted: Orders

## 2020-10-23 NOTE — Telephone Encounter (Signed)
Last filled in 2020. Ok to fill? 

## 2020-10-23 NOTE — Telephone Encounter (Signed)
Refill sent.

## 2020-10-23 NOTE — Telephone Encounter (Signed)
albuterol (PROVENTIL HFA;VENTOLIN HFA) 108 (90 Base) MCG/ACT inhaler   [id number] - [place], [place] - [address] AT [facility] [address] Phone:  [phone]  Fax:  [phone]     The patient is in [place] on vacation and has ran out of his inhaler. Is it possible to have another provider to fill this Rx?

## 2020-11-03 ENCOUNTER — Other Ambulatory Visit (HOSPITAL_COMMUNITY): Payer: Self-pay

## 2020-11-06 ENCOUNTER — Other Ambulatory Visit (HOSPITAL_COMMUNITY): Payer: Self-pay

## 2020-11-06 MED FILL — Apremilast Tab 30 MG: ORAL | 30 days supply | Qty: 60 | Fill #1 | Status: AC

## 2020-11-11 ENCOUNTER — Other Ambulatory Visit (HOSPITAL_COMMUNITY): Payer: Self-pay

## 2020-12-03 ENCOUNTER — Other Ambulatory Visit: Payer: Self-pay | Admitting: Physician Assistant

## 2020-12-03 ENCOUNTER — Other Ambulatory Visit (HOSPITAL_COMMUNITY): Payer: Self-pay

## 2020-12-03 DIAGNOSIS — L400 Psoriasis vulgaris: Secondary | ICD-10-CM

## 2020-12-03 MED ORDER — OTEZLA 30 MG PO TABS
1.0000 | ORAL_TABLET | Freq: Two times a day (BID) | ORAL | 2 refills | Status: DC
  Filled 2020-12-11: qty 60, 30d supply, fill #0
  Filled 2021-02-23 – 2021-04-10 (×3): qty 60, 30d supply, fill #1
  Filled 2021-05-01: qty 60, 30d supply, fill #2

## 2020-12-03 NOTE — Telephone Encounter (Signed)
Next Visit: 12/29/2020  Last Visit: 07/28/2020  Last Fill: 07/14/2020  Dx: Psoriatic arthropathy  Current Dose per office note on 07/28/2020: Otezla 30 mg 1 tablet by mouth twice daily  Okay to refill [place]?

## 2020-12-10 ENCOUNTER — Other Ambulatory Visit (HOSPITAL_COMMUNITY): Payer: Self-pay

## 2020-12-11 ENCOUNTER — Other Ambulatory Visit (HOSPITAL_COMMUNITY): Payer: Self-pay

## 2020-12-29 ENCOUNTER — Ambulatory Visit: Payer: Managed Care, Other (non HMO) | Admitting: Physician Assistant

## 2021-01-01 ENCOUNTER — Other Ambulatory Visit (HOSPITAL_COMMUNITY): Payer: Self-pay

## 2021-01-05 ENCOUNTER — Other Ambulatory Visit (HOSPITAL_COMMUNITY): Payer: Self-pay

## 2021-02-23 ENCOUNTER — Other Ambulatory Visit (HOSPITAL_COMMUNITY): Payer: Self-pay

## 2021-03-06 ENCOUNTER — Other Ambulatory Visit: Payer: Self-pay | Admitting: Family Medicine

## 2021-04-03 ENCOUNTER — Other Ambulatory Visit (HOSPITAL_COMMUNITY): Payer: Self-pay

## 2021-04-10 ENCOUNTER — Other Ambulatory Visit (HOSPITAL_COMMUNITY): Payer: Self-pay

## 2021-04-10 ENCOUNTER — Telehealth: Payer: Self-pay

## 2021-04-10 NOTE — Telephone Encounter (Signed)
Per [place], [place] needed for medication.  Received notification from [facility] regarding a prior authorization for OTEZLA. Authorization has been APPROVED from 04/10/2021 to 04/10/2022.   Patient can continue to fill through [facility]: [phone]   Authorization # [id number] Key: [id number]

## 2021-04-15 IMAGING — US ULTRASOUND SCROTUM DOPPLER COMPLETE
1 series · 14 of 25 positions shown · non-contrast
Comparison: None.

CLINICAL DATA: Right scrotal mass

EXAM:
SCROTAL ULTRASOUND
DOPPLER ULTRASOUND OF THE TESTICLES
TECHNIQUE: Complete ultrasound examination of the testicles, epididymis, and
other scrotal structures was performed. Color and spectral Doppler
ultrasound were also utilized to evaluate blood flow to the
testicles.

[Series 1: ultrasound scrotum doppler complete · 0.07mm/px · 14 of 87 slices shown]
[im 1/87]
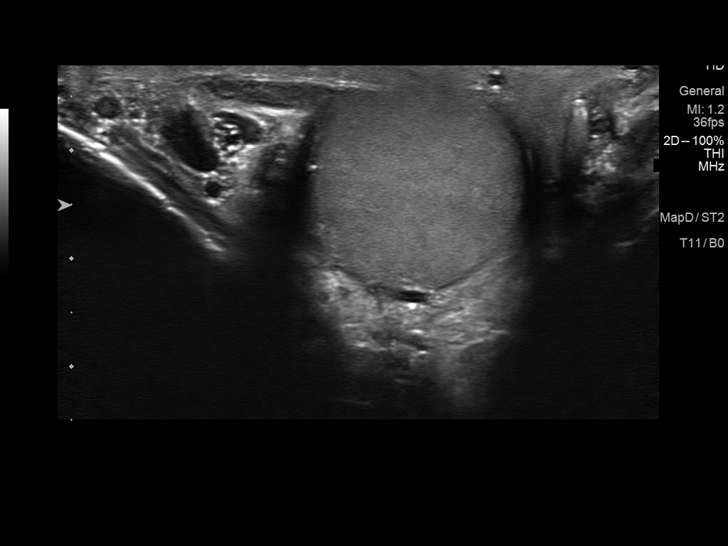
[im 8/87]
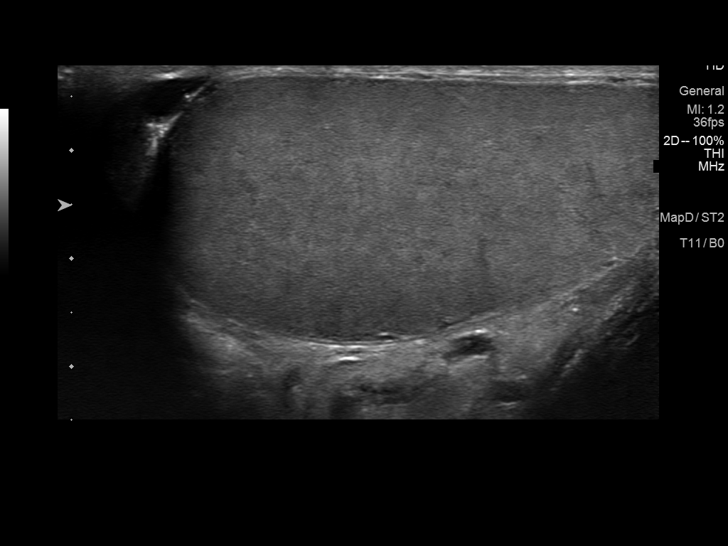
[im 15/87]
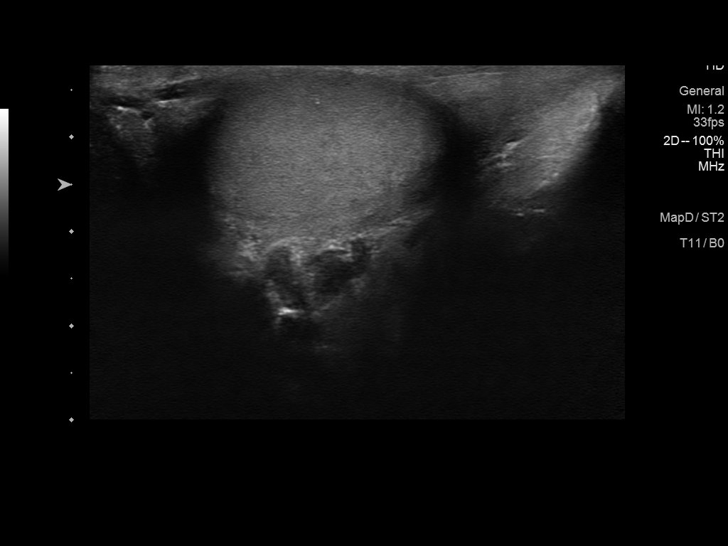
[im 22/87]
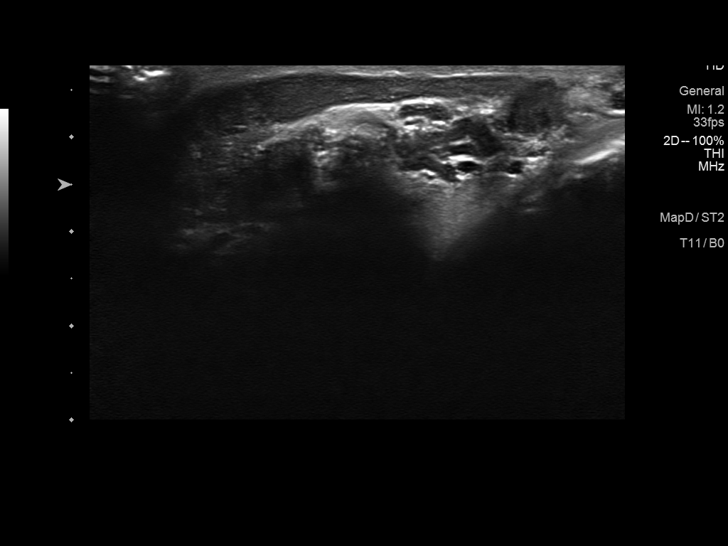
[im 29/87]
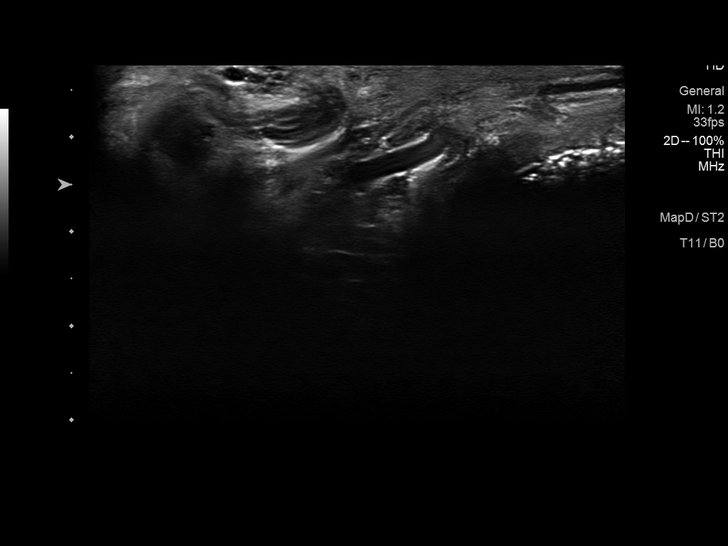
[im 33/87]
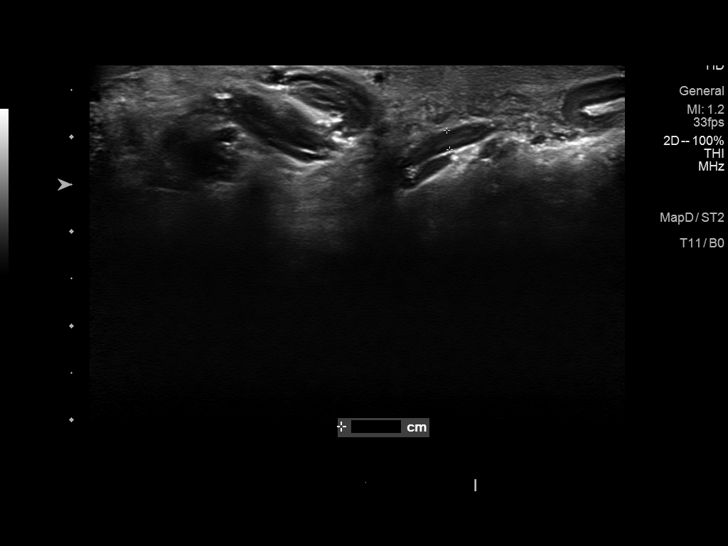
[im 40/87]
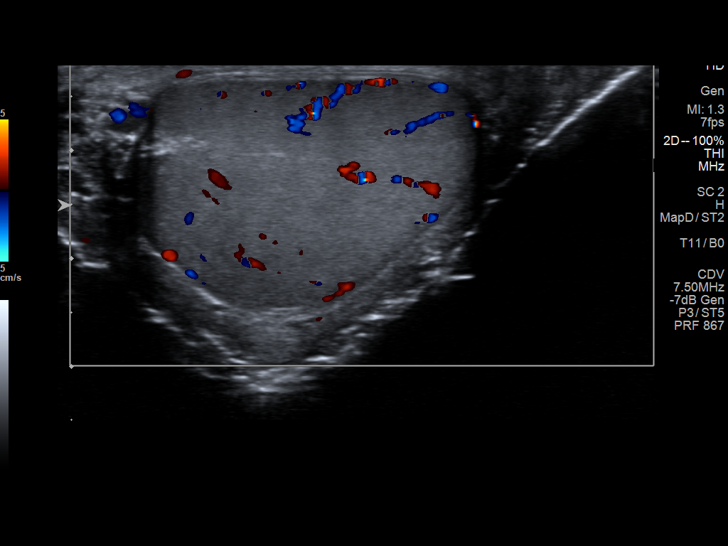
[im 47/87]
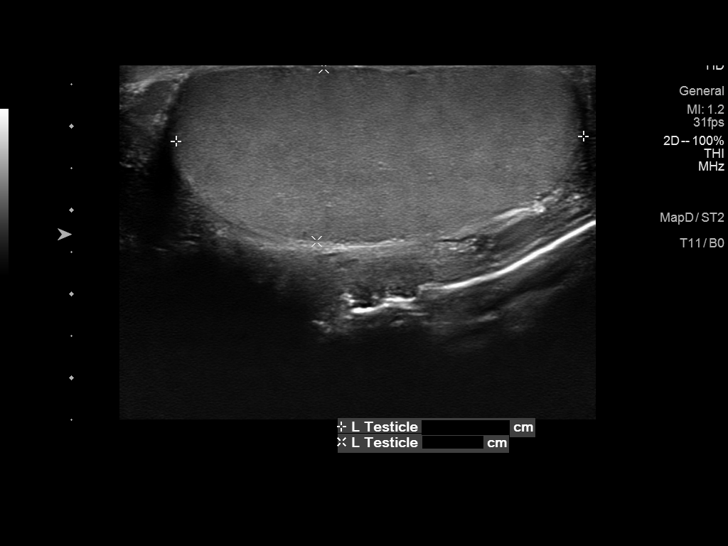
[im 54/87]
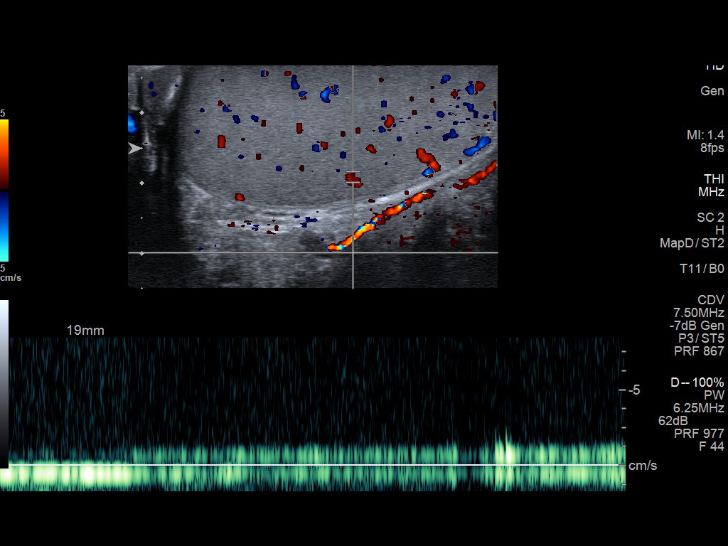
[im 58/87]
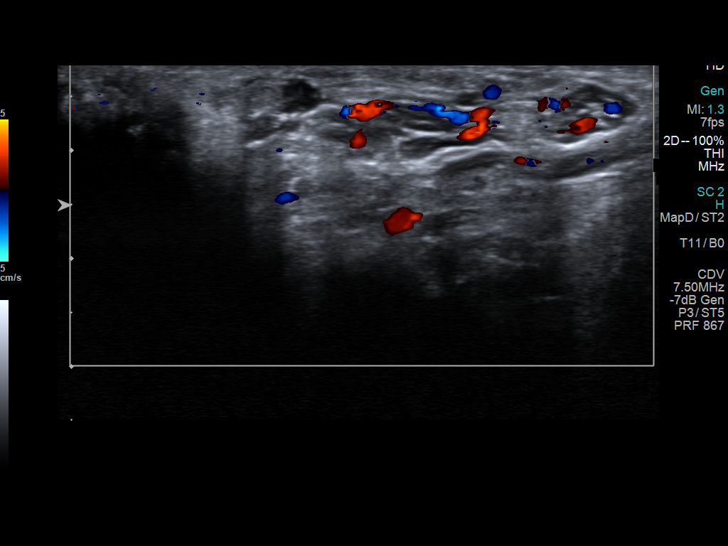
[im 65/87]
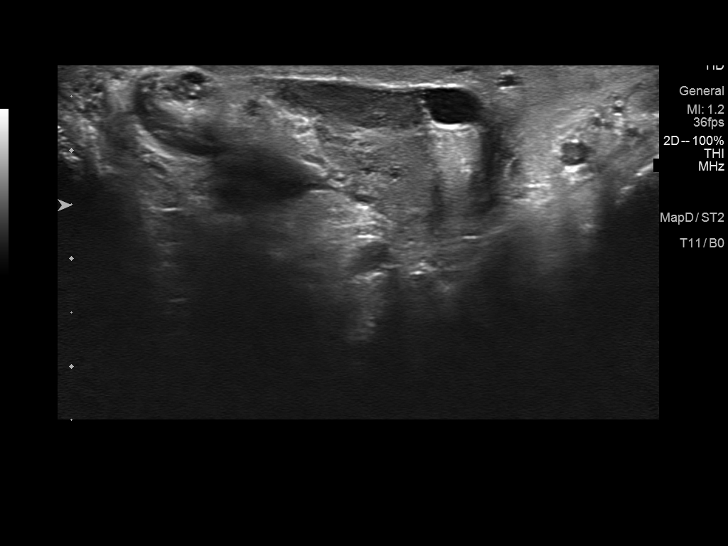
[im 72/87]
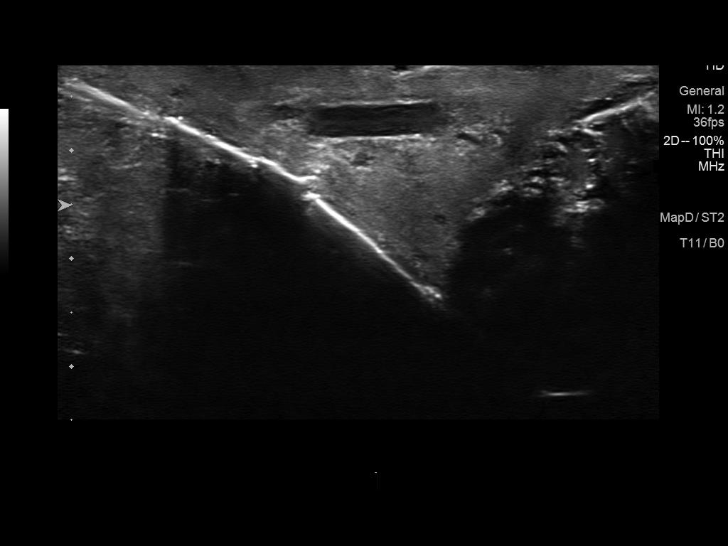
[im 79/87]
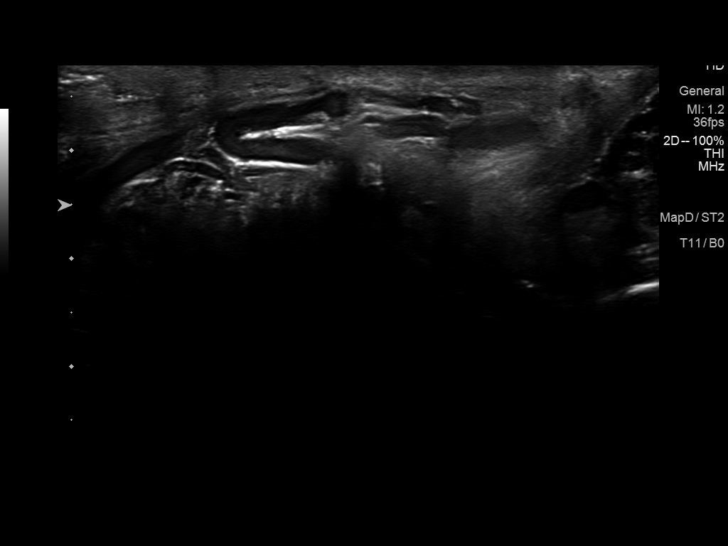
[im 87/87]
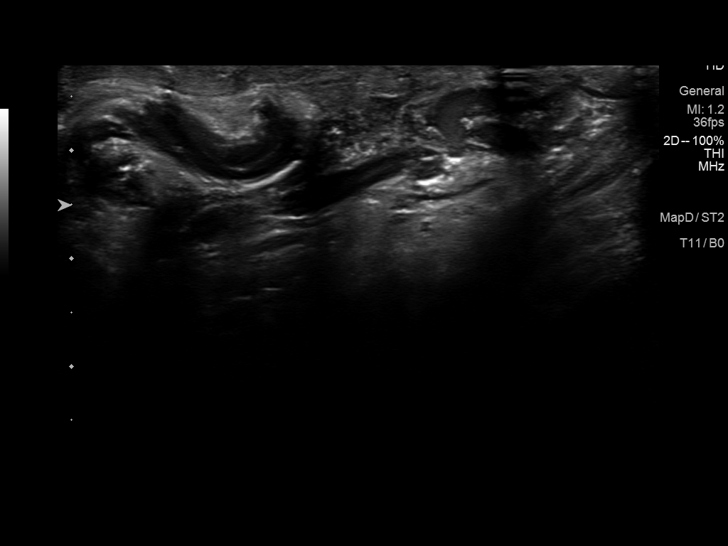

[14 of 25 positions shown; findings below may reference images not displayed]

FINDINGS: Right testicle

Measurements: 4.6 x 2.4 x 3.1 cm. No mass or microlithiasis
visualized.

Left testicle

Measurements: 4.9 x 2.1 x 3.0 cm. No mass or microlithiasis
visualized.

Right epididymis:  5 mm epididymal head cyst.

Left epididymis:  Normal in size and appearance.

Hydrocele:  None visualized.

Varicocele:  Mild left varicocele.

Pulsed Doppler interrogation of both testes demonstrates normal low
resistance arterial and venous waveforms bilaterally.
IMPRESSION: No testicular abnormality.

Small right epididymal head cyst.

Mild left varicocele.

The please pick correct template.

## 2021-04-19 ENCOUNTER — Other Ambulatory Visit (HOSPITAL_COMMUNITY): Payer: Self-pay

## 2021-05-01 ENCOUNTER — Other Ambulatory Visit (HOSPITAL_COMMUNITY): Payer: Self-pay

## 2021-05-13 ENCOUNTER — Telehealth: Payer: Self-pay | Admitting: Family Medicine

## 2021-05-13 ENCOUNTER — Other Ambulatory Visit (HOSPITAL_COMMUNITY): Payer: Self-pay

## 2021-05-13 DIAGNOSIS — J0100 Acute maxillary sinusitis, unspecified: Secondary | ICD-10-CM

## 2021-05-13 MED ORDER — ALBUTEROL SULFATE HFA 108 (90 BASE) MCG/ACT IN AERS
2.0000 | INHALATION_SPRAY | RESPIRATORY_TRACT | 0 refills | Status: DC | PRN
  Filled 2021-05-13: qty 8.5, 16d supply, fill #0

## 2021-05-13 NOTE — Telephone Encounter (Signed)
Pt is calling and would like a refill on albuterol pt state he is have wheezing due to weather and pt has asthma he just ran out of inhaler and would like to know if md would refill medication last seen 07-16-2020 or does pt needs an appt  [id number] - [place], [place] - [address] Phone:  [phone]  Fax:  [phone]

## 2021-05-13 NOTE — Telephone Encounter (Signed)
Pt last acute visit 07/16/20. No CPE noted.  Pt notified of above. CPE scheduled for 05/19/21; pt will come fasting. Advised medication will be refilled just once & more refills can be given at appt. Pt verb understanding.

## 2021-05-14 ENCOUNTER — Other Ambulatory Visit (HOSPITAL_COMMUNITY): Payer: Self-pay

## 2021-05-19 ENCOUNTER — Ambulatory Visit (INDEPENDENT_AMBULATORY_CARE_PROVIDER_SITE_OTHER): Payer: Managed Care, Other (non HMO) | Admitting: Family Medicine

## 2021-05-19 ENCOUNTER — Other Ambulatory Visit: Payer: Self-pay

## 2021-05-19 VITALS — BP 138/90 | HR 90 | Temp 98.2°F | Ht 70.0 in | Wt 257.1 lb

## 2021-05-19 DIAGNOSIS — Z79899 Other long term (current) drug therapy: Secondary | ICD-10-CM | POA: Diagnosis not present

## 2021-05-19 DIAGNOSIS — Z0000 Encounter for general adult medical examination without abnormal findings: Secondary | ICD-10-CM | POA: Diagnosis not present

## 2021-05-19 DIAGNOSIS — Z23 Encounter for immunization: Secondary | ICD-10-CM

## 2021-05-19 LAB — LIPID PANEL
Cholesterol: 195 mg/dL (ref 0–200)
HDL: 41.4 mg/dL (ref >39.00)
LDL Cholesterol: 122 mg/dL — ABNORMAL HIGH (ref 0–99)
NonHDL: 153.86
Total CHOL/HDL Ratio: 5
Triglycerides: 160 mg/dL — ABNORMAL HIGH (ref 0.0–149.0)
VLDL: 32 mg/dL (ref 0.0–40.0)

## 2021-05-19 LAB — HEPATIC FUNCTION PANEL
ALT: 33 U/L (ref 0–53)
AST: 24 U/L (ref 0–37)
Albumin: 4.4 g/dL (ref 3.5–5.2)
Alkaline Phosphatase: 69 U/L (ref 39–117)
Bilirubin, Direct: 0.1 mg/dL (ref 0.0–0.3)
Total Bilirubin: 0.7 mg/dL (ref 0.2–1.2)
Total Protein: 7.3 g/dL (ref 6.0–8.3)

## 2021-05-19 LAB — BASIC METABOLIC PANEL
BUN: 13 mg/dL (ref 6–23)
CO2: 29 mEq/L (ref 19–32)
Calcium: 9.6 mg/dL (ref 8.4–10.5)
Chloride: 102 mEq/L (ref 96–112)
Creatinine, Ser: 1.05 mg/dL (ref 0.40–1.50)
GFR: 87.18 mL/min (ref >60.00)
Glucose, Bld: 97 mg/dL (ref 70–99)
Potassium: 4.8 mEq/L (ref 3.5–5.1)
Sodium: 138 mEq/L (ref 135–145)

## 2021-05-19 LAB — CBC WITH DIFFERENTIAL/PLATELET
Basophils Absolute: 0.1 10*3/uL (ref 0.0–0.1)
Basophils Relative: 1.1 % (ref 0.0–3.0)
Eosinophils Absolute: 0.3 10*3/uL (ref 0.0–0.7)
Eosinophils Relative: 3.2 % (ref 0.0–5.0)
HCT: 44.8 % (ref 39.0–52.0)
Hemoglobin: 15.4 g/dL (ref 13.0–17.0)
Lymphocytes Relative: 20.3 % (ref 12.0–46.0)
Lymphs Abs: 1.6 10*3/uL (ref 0.7–4.0)
MCHC: 34.3 g/dL (ref 30.0–36.0)
MCV: 92.3 fl (ref 78.0–100.0)
Monocytes Absolute: 0.6 10*3/uL (ref 0.1–1.0)
Monocytes Relative: 7.8 % (ref 3.0–12.0)
Neutro Abs: 5.3 10*3/uL (ref 1.4–7.7)
Neutrophils Relative %: 67.6 % (ref 43.0–77.0)
Platelets: 211 10*3/uL (ref 150.0–400.0)
RBC: 4.86 Mil/uL (ref 4.22–5.81)
RDW: 12.1 % (ref 11.5–15.5)
WBC: 7.9 10*3/uL (ref 4.0–10.5)

## 2021-05-19 LAB — TSH: TSH: 1.63 u[IU]/mL (ref 0.35–5.50)

## 2021-05-19 LAB — HEMOGLOBIN A1C: Hgb A1c MFr Bld: 5.6 % (ref 4.6–6.5)

## 2021-05-19 NOTE — Progress Notes (Signed)
Established Patient Office Visit  Subjective:  Patient ID: Donald Pearson, male    DOB: 1976-03-26  Age: 46 y.o. MRN: [id number]  CC:  Chief Complaint  Patient presents with   Annual Exam    HPI Donald Pearson presents for physical exam.  He has history of mild intermittent asthma, psoriatic arthritis, plaque psoriasis, social anxiety disorder.  Ongoing nicotine use but only smokes a few cigarettes per day.  Social anxiety disorder stable on low-dose Lexapro.  Health maintenance reviewed  -Flu vaccine already given -No history of Pneumovax.  He has risk factors including asthma history and history of high risk medicine use with [place]. -Previous hepatitis C screen negative  Family history-his biologic father's history is unknown.  Mother has history of type 2 diabetes and hypertension.  He has a brother who is alive and well.  Maternal aunt with melanoma history.  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  Smokes less than 1/2 pack cigarettes per day Occasional alcohol use.  He is married.  He has 5 year old daughter and 68-year-old son.  Past Medical History:  Diagnosis Date   Asthma    Depression    Psoriatic arthropathy ([place])     Past Surgical History:  Procedure Laterality Date   MOLE REMOVAL  2004   VASECTOMY     WISDOM TOOTH EXTRACTION      Family History  Problem Relation Age of Onset   Diabetes Mother        type ll   Heart disease Maternal Grandmother    Diabetes Maternal Grandmother        type ll   Healthy Brother    Healthy Son    Healthy Daughter     Social History   Socioeconomic History   Marital status: Married    Spouse name: Not on file   Number of children: Not on file   Years of education: Not on file   Highest education level: Not on file  Occupational History   Not on file  Tobacco Use   Smoking status: Every Day    Packs/day: 0.30    Types: Cigarettes    Start  date: 2006   Smokeless tobacco: Never   Tobacco comments:    smokes 1/4 pack per day 09/15/2020  Vaping Use   Vaping Use: Former  Substance and Sexual Activity   Alcohol use: Yes    Alcohol/week: 7.0 standard drinks    Types: 7 Shots of liquor per week    Comment: daily    Drug use: No   Sexual activity: Not on file  Other Topics Concern   Not on file  Social History Narrative   Not on file   Social Determinants of Health   Financial Resource Strain: Not on file  Food Insecurity: Not on file  Transportation Needs: Not on file  Physical Activity: Not on file  Stress: Not on file  Social Connections: Not on file  Intimate Partner Violence: Not on file    Outpatient Medications Prior to Visit  Medication Sig Dispense Refill   albuterol (VENTOLIN HFA) 108 (90 Base) MCG/ACT inhaler Inhale 2 puffs into the lungs every 4 (four) hours as needed for wheezing. 8.5 g 0   Apremilast (OTEZLA) 30 MG TABS TAKE 1 TABLET BY MOUTH 2 TIMES DAILY. 60 tablet 2   cetirizine (ZYRTEC) 10 MG tablet Take 10 mg by mouth daily.     escitalopram (LEXAPRO) 10 MG tablet  TAKE ONE TABLET BY MOUTH DAILY 90 tablet 3   Multiple Vitamin (MULTIVITAMIN PO) Take by mouth daily.     naproxen sodium (ALEVE) 220 MG tablet Take 220 mg by mouth as needed.      fluticasone (FLONASE) 50 MCG/ACT nasal spray Place 1 spray into both nostrils daily.     No facility-administered medications prior to visit.    Allergies  Allergen Reactions   Penicillins     As a child, rash    ROS Review of Systems  Constitutional:  Negative for activity change, appetite change, fatigue and fever.  HENT:  Negative for congestion, ear pain and trouble swallowing.   Eyes:  Negative for pain and visual disturbance.  Respiratory:  Negative for cough, shortness of breath and wheezing.   Cardiovascular:  Negative for chest pain and palpitations.  Gastrointestinal:  Negative for abdominal distention, abdominal pain, blood in stool,  constipation, diarrhea, nausea, rectal pain and vomiting.  Genitourinary:  Negative for dysuria, hematuria and testicular pain.  Musculoskeletal:  Negative for arthralgias and joint swelling.  Skin:  Negative for rash.  Neurological:  Negative for dizziness, syncope and headaches.  Hematological:  Negative for adenopathy.  Psychiatric/Behavioral:  Negative for confusion and dysphoric mood.      Objective:    Physical Exam Constitutional:      General: He is not in acute distress.    Appearance: He is well-developed.  HENT:     Head: Normocephalic and atraumatic.     Right Ear: External ear normal.     Left Ear: External ear normal.  Eyes:     Conjunctiva/sclera: Conjunctivae normal.     Pupils: Pupils are equal, round, and reactive to light.  Neck:     Thyroid: No thyromegaly.  Cardiovascular:     Rate and Rhythm: Normal rate and regular rhythm.     Heart sounds: Normal heart sounds. No murmur heard. Pulmonary:     Effort: No respiratory distress.     Breath sounds: No wheezing or rales.  Abdominal:     General: Bowel sounds are normal. There is no distension.     Palpations: Abdomen is soft. There is no mass.     Tenderness: There is no abdominal tenderness. There is no guarding or rebound.  Musculoskeletal:     Cervical back: Normal range of motion and neck supple.     Right lower leg: No edema.     Left lower leg: No edema.  Lymphadenopathy:     Cervical: No cervical adenopathy.  Neurological:     Mental Status: He is alert and oriented to person, place, and time.     Cranial Nerves: No cranial nerve deficit.    BP 138/90 (BP Location: Left Arm, Cuff Size: Normal)    Pulse 90    Temp 98.2 F (36.8 C) (Oral)    Ht 5\' 10"  (1.778 m)    Wt 257 lb 1.6 oz (116.6 kg)    SpO2 95%    BMI 36.89 kg/m  Wt Readings from Last 3 Encounters:  05/19/21 257 lb 1.6 oz (116.6 kg)  09/15/20 248 lb 3.2 oz (112.6 kg)  08/19/20 251 lb 9.6 oz (114.1 kg)     Health Maintenance Due   Topic Date Due   Pneumococcal Vaccine 69-8 Years old (1 - PCV) Never done   COVID-19 Vaccine (3 - Pfizer risk series) 09/24/2019    There are no preventive care reminders to display for this patient.  Lab Results  Component  Value Date   TSH 1.19 11/01/2017   Lab Results  Component Value Date   WBC 7.7 06/16/2018   HGB 16.0 06/16/2018   HCT 45.8 06/16/2018   MCV 92.5 06/16/2018   PLT 194 06/16/2018   Lab Results  Component Value Date   NA 140 06/16/2018   K 4.1 06/16/2018   CO2 28 06/16/2018   GLUCOSE 81 06/16/2018   BUN 15 06/16/2018   CREATININE 1.00 06/16/2018   BILITOT 0.7 06/16/2018   ALKPHOS 79 11/01/2017   AST 22 06/16/2018   ALT 31 06/16/2018   PROT 7.0 06/16/2018   PROT 7.0 06/16/2018   ALBUMIN 4.8 11/01/2017   CALCIUM 9.7 06/16/2018   GFR 87.15 11/01/2017   No results found for: CHOL No results found for: HDL No results found for: LDLCALC No results found for: TRIG No results found for: CHOLHDL No results found for: HGBA1C    Assessment & Plan:   Problem List Items Addressed This Visit   None Visit Diagnoses     Physical exam    -  Primary   Relevant Orders   Basic metabolic panel   Lipid panel   TSH   Hepatic function panel   CBC with Differential/Platelet   Hemoglobin A1c   High risk medication use       Relevant Orders   Pneumococcal polysaccharide vaccine 23-valent greater than or equal to 2yo subcutaneous/IM     We discussed the following health maintenance issues  -Flu vaccine already given -Recommend Pneumovax with his asthma history and history of immunosuppressant use -Obtain screening labs as above.  Include A1c with his positive family history of type 2 diabetes -He strongly encouraged to lose some weight.  He has home monitor for blood pressure and be in touch if consistently greater than 140/90. -Handout on DASH diet given -Strongly encouraged to stop smoking altogether -We discussed colon cancer screening age 70  No  orders of the defined types were placed in this encounter.   Follow-up: No follow-ups on file.    Carolann Littler, MD

## 2021-05-27 ENCOUNTER — Other Ambulatory Visit (HOSPITAL_COMMUNITY): Payer: Self-pay

## 2021-05-28 ENCOUNTER — Other Ambulatory Visit (HOSPITAL_COMMUNITY): Payer: Self-pay

## 2021-05-28 ENCOUNTER — Other Ambulatory Visit: Payer: Self-pay | Admitting: Physician Assistant

## 2021-05-28 DIAGNOSIS — L400 Psoriasis vulgaris: Secondary | ICD-10-CM

## 2021-06-22 ENCOUNTER — Other Ambulatory Visit (HOSPITAL_COMMUNITY): Payer: Self-pay

## 2021-06-23 ENCOUNTER — Other Ambulatory Visit (HOSPITAL_COMMUNITY): Payer: Self-pay

## 2021-06-26 ENCOUNTER — Other Ambulatory Visit (HOSPITAL_COMMUNITY): Payer: Self-pay

## 2021-07-01 ENCOUNTER — Other Ambulatory Visit (HOSPITAL_COMMUNITY): Payer: Self-pay

## 2021-07-06 ENCOUNTER — Other Ambulatory Visit (HOSPITAL_COMMUNITY): Payer: Self-pay

## 2021-07-09 ENCOUNTER — Other Ambulatory Visit (HOSPITAL_COMMUNITY): Payer: Self-pay

## 2021-07-24 ENCOUNTER — Other Ambulatory Visit (HOSPITAL_COMMUNITY): Payer: Self-pay

## 2021-07-30 ENCOUNTER — Telehealth: Payer: Managed Care, Other (non HMO) | Admitting: Family Medicine

## 2021-07-30 ENCOUNTER — Encounter: Payer: Self-pay | Admitting: Family Medicine

## 2021-07-30 VITALS — Ht 70.0 in | Wt 257.1 lb

## 2021-07-30 DIAGNOSIS — R0981 Nasal congestion: Secondary | ICD-10-CM | POA: Diagnosis not present

## 2021-07-30 MED ORDER — DOXYCYCLINE HYCLATE 100 MG PO TABS: 100.0000 mg | ORAL_TABLET | Freq: Two times a day (BID) | ORAL | 0 refills | Status: DC

## 2021-07-30 NOTE — Progress Notes (Signed)
Virtual Visit via Video Note ? ?I connected with Donald Pearson ? on 07/30/21 at 10:00 AM EDT by a video enabled telemedicine application and verified that I am speaking with the correct person using two identifiers. ? Location patient: [place] ?Location provider:work or home office ?Persons participating in the virtual visit: patient, provider ? ?I discussed the limitations and requested verbal permission for telemedicine visit. The patient expressed understanding and agreed to proceed. ? ? ?HPI: ? ?Acute telemedicine visit for sinus issues: ?-he gets allergy issues this time of the year ?-Onset: 3 weeks ago ?-Symptoms include: nasal sinus congestion, now with sinus discomfort ?-Denies:fever, CP, SOB, wheezing, alb use, body aches ?-Has tried: zyrtec D ?-Pertinent past medical history: see below ?-Pertinent medication allergies: ?Allergies  ?Allergen Reactions  ? Penicillins   ?  As a child, rash  ?-COVID-19 vaccine status:  ?Immunization History  ?Administered Date(s) Administered  ? Influenza,inj,Quad PF,6+ Mos 03/24/2019, 01/29/2020  ? Influenza-Unspecified 03/17/2016, 03/08/2017  ? PFIZER(Purple Top)SARS-COV-2 Vaccination 08/02/2019, 08/27/2019  ? Pneumococcal Polysaccharide-23 05/19/2021  ? Tdap 06/17/2014  ? ? ? ?ROS: See pertinent positives and negatives per HPI. ? ?Past Medical History:  ?Diagnosis Date  ? Asthma   ? Depression   ? Psoriatic arthropathy (H[facility])   ? ? ?Past Surgical History:  ?Procedure Laterality Date  ? MOLE REMOVAL  2004  ? VASECTOMY    ? WISDOM TOOTH EXTRACTION    ? ? ? ?Current Outpatient Medications:  ?  albuterol (VENTOLIN HFA) 108 (90 Base) MCG/ACT inhaler, Inhale 2 puffs into the lungs every 4 (four) hours as needed for wheezing., Disp: 8.5 g, Rfl: 0 ?  Apremilast (OTEZLA) 30 MG TABS, TAKE 1 TABLET BY MOUTH 2 TIMES DAILY., Disp: 60 tablet, Rfl: 2 ?  cetirizine (ZYRTEC) 10 MG tablet, Take 10 mg by mouth daily., Disp: , Rfl:  ?  doxycycline (VIBRA-TABS) 100 MG tablet, Take 1 tablet (100 mg total) by  mouth 2 (two) times daily., Disp: 14 tablet, Rfl: 0 ?  escitalopram (LEXAPRO) 10 MG tablet, TAKE ONE TABLET BY MOUTH DAILY, Disp: 90 tablet, Rfl: 3 ?  Multiple Vitamin (MULTIVITAMIN PO), Take by mouth daily., Disp: , Rfl:  ?  naproxen sodium (ALEVE) 220 MG tablet, Take 220 mg by mouth as needed. , Disp: , Rfl:  ?  fluticasone (FLONASE) 50 MCG/ACT nasal spray, Place 1 spray into both nostrils daily., Disp: , Rfl:  ? ?EXAM: ? ?VITALS per patient if applicable: ? ?GENERAL: alert, oriented, appears well and in no acute distress ? ?HEENT: atraumatic, conjunttiva clear, no obvious abnormalities on inspection of external nose and ears ? ?NECK: normal movements of the head and neck ? ?LUNGS: on inspection no signs of respiratory distress, breathing rate appears normal, no obvious gross SOB, gasping or wheezing ? ?CV: no obvious cyanosis ? ?MS: moves all visible extremities without noticeable abnormality ? ?PSYCH/NEURO: pleasant and cooperative, no obvious depression or anxiety, speech and thought processing grossly intact ? ?ASSESSMENT AND PLAN: ? ?Discussed the following assessment and plan: ? ?Nasal congestion ? ?-we discussed possible serious and likely etiologies, options for evaluation and workup, limitations of telemedicine visit vs in person visit, treatment, treatment risks and precautions. Pt is agreeable to treatment via telemedicine at this moment. Query Allergic Rhinitis, Sinusitis vs other. He has opted to try switching to zyrtec, flonase and nasal saline, with initiation of doxy 100mg  bid x 7-10 days if worsening or not improving with the other measures. Discussed abx risks.  ? ?Advised to seek prompt virtual  visit or in person care if worsening, new symptoms arise, or if is not improving with treatment as expected per our conversation of expected course. Discussed options for follow up care. Did let this patient know that I do telemedicine on Tuesdays and Thursdays for [place] and those are the days I am  logged into the system. Advised to schedule follow up visit with PCP, [place] virtual visits or [facility] if any further questions or concerns to avoid delays in care. ?  ?I discussed the assessment and treatment plan with the patient. The patient was provided an opportunity to ask questions and all were answered. The patient agreed with the plan and demonstrated an understanding of the instructions. ?  ? ? ?Terressa Koyanagi, DO  ? ?

## 2021-07-30 NOTE — Patient Instructions (Signed)
-  try switching to plain zyrtec once daily ? ?-start flonase 2 sprays each nostril daily for 21 days, then 1 spray each nostril daily ? ?-use nasal saline rinse twice daily for 3+ days ? ?-I sent the antibiotic we discussed to your pharmacy: ?Meds ordered this encounter  ?Medications  ? doxycycline (VIBRA-TABS) 100 MG tablet  ?  Sig: Take 1 tablet (100 mg total) by mouth 2 (two) times daily.  ?  Dispense:  14 tablet  ?  Refill:  0  ? ? ? ?I hope you are feeling better soon! ? ?Seek in person care promptly if your symptoms worsen, new concerns arise or you are not improving with treatment. ? ?It was nice to meet you today. I help [facility] out with telemedicine visits on Tuesdays and Thursdays and am happy to help if you need a virtual follow up visit on those days. Otherwise, if you have any concerns or questions following this visit please schedule a follow up visit with your Primary Care office or seek care at a local urgent care clinic to avoid delays in care ? ?

## 2021-08-12 ENCOUNTER — Other Ambulatory Visit (HOSPITAL_COMMUNITY): Payer: Self-pay

## 2021-08-26 ENCOUNTER — Ambulatory Visit: Payer: Managed Care, Other (non HMO) | Admitting: Rheumatology

## 2021-08-26 NOTE — Progress Notes (Signed)
? ?Office Visit Note ? ?Patient: Donald Pearson             ?Date of Birth: [birth year]           ?MRN: [id number]             ?PCP: Eulas Post, MD ?Referring: Eulas Post, MD ?Visit Date: 09/01/2021 ?Occupation: @GUAROCC @ ? ?Subjective:  ?Discuss restarting otezla  ? ?History of Present Illness: Donald Pearson is a 46 y.o. male with history of psoriatic arthritis.  Patient has been off of [place] since February 2023.  He was previously tolerating Otezla without any side effects and would like a refill sent to the pharmacy today.  He states that he has intermittent pain in his right shoulder, right elbow, and right knee joint but denies any joint swelling.  He has had SI joint discomfort intermittently.  He has been taking Aleve as needed for symptomatic relief.  He has some psoriasis on the anterior surface of both knees and extensor surface of both elbows.  He denies any Achilles tendinitis or plantar fasciitis. ?He denies any recent infections. ? ? ?Activities of Daily Living:  ?Patient reports morning stiffness for 30 minutes.   ?Patient Denies nocturnal pain.  ?Difficulty dressing/grooming: Denies ?Difficulty climbing stairs: Denies ?Difficulty getting out of chair: Denies ?Difficulty using hands for taps, buttons, cutlery, and/or writing: Denies ? ?Review of Systems  ?Constitutional:  Positive for fatigue.  ?HENT:  Negative for mouth sores, mouth dryness and nose dryness.   ?Eyes:  Negative for pain, itching and dryness.  ?Respiratory:  Negative for shortness of breath and difficulty breathing.   ?Cardiovascular:  Negative for chest pain and palpitations.  ?Gastrointestinal:  Negative for blood in stool, constipation and diarrhea.  ?Endocrine: Negative for increased urination.  ?Genitourinary:  Negative for difficulty urinating.  ?Musculoskeletal:  Positive for joint pain, joint pain, myalgias, morning stiffness, muscle tenderness and myalgias. Negative for joint swelling.  ?Skin:  Positive for  rash. Negative for color change.  ?Allergic/Immunologic: Negative for susceptible to infections.  ?Neurological:  Negative for dizziness, numbness, headaches, memory loss and weakness.  ?Hematological:  Negative for bruising/bleeding tendency.  ?Psychiatric/Behavioral:  Negative for confusion.   ? ?PMFS History:  ?Patient Active Problem List  ? Diagnosis Date Noted  ? Psoriatic arthropathy ([place]) 06/28/2018  ? Family history of psoriasis 06/28/2018  ? Smoker 06/28/2018  ? Sacroiliitis ([place]) 06/28/2018  ? Plaque psoriasis 12/02/2016  ? Social anxiety disorder 07/15/2011  ? History of depression 01/25/2011  ? Asthma, mild intermittent 01/25/2011  ?  ?Past Medical History:  ?Diagnosis Date  ? Asthma   ? Depression   ? Psoriatic arthropathy ([place])   ?  ?Family History  ?Problem Relation Age of Onset  ? Diabetes Mother   ?     type ll  ? Heart disease Maternal Grandmother   ? Diabetes Maternal Grandmother   ?     type ll  ? Healthy Brother   ? Healthy Son   ? Healthy Daughter   ? ?Past Surgical History:  ?Procedure Laterality Date  ? MOLE REMOVAL  2004  ? VASECTOMY    ? WISDOM TOOTH EXTRACTION    ? ?Social History  ? ?Social History Narrative  ? Not on file  ? ?Immunization History  ?Administered Date(s) Administered  ? Influenza,inj,Quad PF,6+ Mos 03/24/2019, 01/29/2020  ? Influenza-Unspecified 03/17/2016, 03/08/2017  ? PFIZER(Purple Top)SARS-COV-2 Vaccination 08/02/2019, 08/27/2019  ? Pneumococcal Polysaccharide-23 05/19/2021  ? Tdap 06/17/2014  ?  ? ?Objective: ?  Vital Signs: BP (!) 154/102 (BP Location: Left Arm, Patient Position: Sitting, Cuff Size: Large)   Pulse 90   Ht 5\' 10"  (1.778 m)   Wt 255 lb (115.7 kg)   BMI 36.59 kg/m?   ? ?Physical Exam ?Vitals and nursing note reviewed.  ?Constitutional:   ?   Appearance: He is well-developed.  ?HENT:  ?   Head: Normocephalic and atraumatic.  ?Eyes:  ?   Conjunctiva/sclera: Conjunctivae normal.  ?   Pupils: Pupils are equal, round, and reactive to light.   ?Cardiovascular:  ?   Rate and Rhythm: Normal rate and regular rhythm.  ?   Heart sounds: Normal heart sounds.  ?Pulmonary:  ?   Effort: Pulmonary effort is normal.  ?   Breath sounds: Normal breath sounds.  ?Abdominal:  ?   General: Bowel sounds are normal.  ?   Palpations: Abdomen is soft.  ?Musculoskeletal:  ?   Cervical back: Normal range of motion and neck supple.  ?Skin: ?   General: Skin is warm and dry.  ?   Capillary Refill: Capillary refill takes less than 2 seconds.  ?Neurological:  ?   Mental Status: He is alert and oriented to person, place, and time.  ?Psychiatric:     ?   Behavior: Behavior normal.  ?  ? ?Musculoskeletal Exam: C-spine, thoracic spine, and lumbar spine have good range of motion.  No midline spinal tenderness.  Some tenderness to palpation over both SI joints.  Shoulder joints, with joints, wrist joints, MCPs, PIPs, DIPs have good range of motion with no synovitis.  Complete fist formation bilaterally.  Hip joints have good range of motion with no groin pain.  Knee joints have good range of motion with some fullness in the right knee but no effusion or warmth was noted.  Ankle joints have good range of motion with no tenderness or joint swelling.  No evidence of achilles tendonitis or plantar fasciitis.  ? ?CDAI Exam: ?CDAI Score: -- ?Patient Global: --; Provider Global: -- ?Swollen: --; Tender: -- ?Joint Exam 09/01/2021  ? ?No joint exam has been documented for this visit  ? ?There is currently no information documented on the homunculus. Go to the Rheumatology activity and complete the homunculus joint exam. ? ?Investigation: ?No additional findings. ? ?Imaging: ?No results found. ? ?Recent Labs: ?Lab Results  ?Component Value Date  ? WBC 7.9 05/19/2021  ? HGB 15.4 05/19/2021  ? PLT 211.0 05/19/2021  ? NA 138 05/19/2021  ? K 4.8 05/19/2021  ? CL 102 05/19/2021  ? CO2 29 05/19/2021  ? GLUCOSE 97 05/19/2021  ? BUN 13 05/19/2021  ? CREATININE 1.05 05/19/2021  ? BILITOT 0.7 05/19/2021  ?  ALKPHOS 69 05/19/2021  ? AST 24 05/19/2021  ? ALT 33 05/19/2021  ? PROT 7.3 05/19/2021  ? ALBUMIN 4.4 05/19/2021  ? CALCIUM 9.6 05/19/2021  ? GFRAA 109 06/16/2018  ? QFTBGOLDPLUS NEGATIVE 06/16/2018  ? ? ?Speciality Comments: No specialty comments available. ? ?Procedures:  ?No procedures performed ?Allergies: Penicillins  ? ?Assessment / Plan:     ?Visit Diagnoses: Psoriatic arthropathy ([place]): He has no synovitis or dactylitis on examination today.  He experiences intermittent arthralgias in the right shoulder, right elbow, and right knee joint.  He has also been experiencing intermittent pain in both SI joints.  He has been taking [place] as needed for symptomatic relief.  He has not noticed any joint swelling.  His morning stiffness has been lasting about 30 minutes daily and  he has not been experiencing any nocturnal pain or difficulty with ADLs.  He has been out of his prescription for [place] since February 2023 and requested a refill today.  He was previously tolerating Otezla 30 mg 1 tablet by mouth twice daily.  A new prescription will be sent to the pharmacy today.  He was advised to notify us if he develops increased joint pain or joint swelling.  He will follow-up in the office in 5 months or sooner if needed. ? ?Plaque psoriasis: Thickened patches of plaque psoriasis on the anterior surface of both knees and extensor surface of both elbows noted.  He has been using triamcinolone cream topically as needed.  A refill of otezla will be sent to the pharmacy today. ? ?High risk medication use - Otezla 30 mg 1 tablet by mouth twice daily (started in February 2020).  A new prescription for [place] will be sent to the pharmacy today. ?CBC, BMP, and hepatic function panel updated on 05/19/2021 ? ?Lateral epicondylitis, left elbow: He experiences intermittent discomfort in both elbow joints.  ? ?Chronic midline low back pain without sciatica -x-rays of the lumbar spine were updated on 07/28/2020: Mild degenerative  changes and facet joint arthropathy.   No midline spinal tenderness.    ? ?Sacroiliitis ([place]) - X-rays were unremarkable on 07/28/20.  He has been experiencing intermittent SI joint pain bilaterally and takes aleve as neede

## 2021-09-01 ENCOUNTER — Ambulatory Visit: Payer: Managed Care, Other (non HMO) | Admitting: Physician Assistant

## 2021-09-01 ENCOUNTER — Other Ambulatory Visit (HOSPITAL_COMMUNITY): Payer: Self-pay

## 2021-09-01 ENCOUNTER — Encounter: Payer: Self-pay | Admitting: Physician Assistant

## 2021-09-01 VITALS — BP 154/102 | HR 90 | Ht 70.0 in | Wt 255.0 lb

## 2021-09-01 DIAGNOSIS — Z79899 Other long term (current) drug therapy: Secondary | ICD-10-CM | POA: Diagnosis not present

## 2021-09-01 DIAGNOSIS — M461 Sacroiliitis, not elsewhere classified: Secondary | ICD-10-CM

## 2021-09-01 DIAGNOSIS — F4010 Social phobia, unspecified: Secondary | ICD-10-CM

## 2021-09-01 DIAGNOSIS — M7712 Lateral epicondylitis, left elbow: Secondary | ICD-10-CM | POA: Diagnosis not present

## 2021-09-01 DIAGNOSIS — L400 Psoriasis vulgaris: Secondary | ICD-10-CM | POA: Diagnosis not present

## 2021-09-01 DIAGNOSIS — F17200 Nicotine dependence, unspecified, uncomplicated: Secondary | ICD-10-CM

## 2021-09-01 DIAGNOSIS — L4050 Arthropathic psoriasis, unspecified: Secondary | ICD-10-CM

## 2021-09-01 DIAGNOSIS — G8929 Other chronic pain: Secondary | ICD-10-CM

## 2021-09-01 DIAGNOSIS — M545 Low back pain, unspecified: Secondary | ICD-10-CM

## 2021-09-01 DIAGNOSIS — Z840 Family history of diseases of the skin and subcutaneous tissue: Secondary | ICD-10-CM

## 2021-09-01 DIAGNOSIS — Z8659 Personal history of other mental and behavioral disorders: Secondary | ICD-10-CM

## 2021-09-01 DIAGNOSIS — Z8709 Personal history of other diseases of the respiratory system: Secondary | ICD-10-CM

## 2021-09-01 MED ORDER — OTEZLA 30 MG PO TABS
1.0000 | ORAL_TABLET | Freq: Two times a day (BID) | ORAL | 2 refills | Status: DC
  Filled 2021-09-01 – 2021-09-11 (×2): qty 60, 30d supply, fill #0
  Filled 2021-10-07: qty 60, 30d supply, fill #1
  Filled 2021-11-03: qty 60, 30d supply, fill #2

## 2021-09-02 ENCOUNTER — Other Ambulatory Visit (HOSPITAL_COMMUNITY): Payer: Self-pay

## 2021-09-11 ENCOUNTER — Other Ambulatory Visit (HOSPITAL_COMMUNITY): Payer: Self-pay

## 2021-10-07 ENCOUNTER — Other Ambulatory Visit (HOSPITAL_COMMUNITY): Payer: Self-pay

## 2021-10-08 ENCOUNTER — Other Ambulatory Visit (HOSPITAL_COMMUNITY): Payer: Self-pay

## 2021-11-02 ENCOUNTER — Other Ambulatory Visit (HOSPITAL_COMMUNITY): Payer: Self-pay

## 2021-11-03 ENCOUNTER — Other Ambulatory Visit (HOSPITAL_COMMUNITY): Payer: Self-pay

## 2021-11-09 ENCOUNTER — Other Ambulatory Visit (HOSPITAL_COMMUNITY): Payer: Self-pay

## 2021-12-02 ENCOUNTER — Other Ambulatory Visit (HOSPITAL_COMMUNITY): Payer: Self-pay

## 2021-12-03 ENCOUNTER — Other Ambulatory Visit (HOSPITAL_COMMUNITY): Payer: Self-pay

## 2021-12-03 ENCOUNTER — Other Ambulatory Visit: Payer: Self-pay | Admitting: Physician Assistant

## 2021-12-03 DIAGNOSIS — L400 Psoriasis vulgaris: Secondary | ICD-10-CM

## 2021-12-03 MED ORDER — OTEZLA 30 MG PO TABS
1.0000 | ORAL_TABLET | Freq: Two times a day (BID) | ORAL | 2 refills | Status: DC
  Filled 2021-12-03: qty 60, 30d supply, fill #0

## 2021-12-03 NOTE — Telephone Encounter (Signed)
Next Visit: 02/05/2022  Last Visit: 09/01/2021  Last Fill: 09/01/2021  DX:  Psoriatic arthropathy   Current Dose per office note 09/01/2021: [place] 30 mg 1 tablet by mouth twice daily   Labs: 05/19/2021 CBC normal, BMP normal  Okay to refill [place]?

## 2021-12-03 NOTE — Telephone Encounter (Signed)
Next Visit: 02/05/2022  Last Visit: 09/01/2021  Last Fill: 09/01/2021  DX: Psoriatic arthropathy   Current Dose per office note 09/01/2021: [place] 30 mg 1 tablet by mouth twice daily   Labs: 05/19/2021 WNL  Okay to refill [place]?

## 2021-12-04 ENCOUNTER — Other Ambulatory Visit (HOSPITAL_COMMUNITY): Payer: Self-pay

## 2021-12-07 ENCOUNTER — Other Ambulatory Visit (HOSPITAL_COMMUNITY): Payer: Self-pay

## 2021-12-16 ENCOUNTER — Other Ambulatory Visit: Payer: Self-pay | Admitting: Pharmacist

## 2021-12-16 ENCOUNTER — Other Ambulatory Visit (HOSPITAL_COMMUNITY): Payer: Self-pay

## 2021-12-16 DIAGNOSIS — L400 Psoriasis vulgaris: Secondary | ICD-10-CM

## 2021-12-16 MED ORDER — OTEZLA 30 MG PO TABS
1.0000 | ORAL_TABLET | Freq: Two times a day (BID) | ORAL | 0 refills | Status: DC
  Filled 2021-12-16: qty 120, 60d supply, fill #0
  Filled 2021-12-28: qty 60, 30d supply, fill #0
  Filled 2022-03-08: qty 60, 30d supply, fill #1

## 2021-12-16 NOTE — Telephone Encounter (Signed)
Rx for Otezla 30mg  twice daily sent to [facility] for hospital-based cost pricing.   Total qty remaining is 120 tabs  [facility], PharmD, MPH, BCPS, CPP Clinical Pharmacist (Rheumatology and Pulmonology)

## 2021-12-28 ENCOUNTER — Other Ambulatory Visit (HOSPITAL_COMMUNITY): Payer: Self-pay

## 2022-01-08 ENCOUNTER — Other Ambulatory Visit (HOSPITAL_COMMUNITY): Payer: Self-pay

## 2022-01-22 NOTE — Progress Notes (Signed)
Office Visit Note  Patient: Donald Pearson             Date of Birth: July 28, 1975           MRN: [id number]             PCP: Donald Covey, MD Referring: Donald Covey, MD Visit Date: 02/05/2022 Occupation: @GUAROCC @  Subjective:  Medication management  History of Present Illness: Donald Pearson is a 46 y.o. male with history of psoriatic arthritis and psoriasis.  He states he has been taking Otezla 30 mg p.o. twice daily on a regular basis.  He continues to have some rash on his elbows and he is also noted some nail changes.  He is not using any topical agents.  He states in July he fell by the pool and injured his right knee.  He has been seen by an orthopedic surgeon at [facility].  He states he has meniscal tear and a tendon tear.  He is wearing a brace currently.  He will have a follow-up appointment to evaluate.  He denies any history of Planter fasciitis or Achilles tendinitis.  There is no history of uveitis or shortness of breath.  Activities of Daily Living:  Patient reports morning stiffness for 5-10 minutes.   Patient Denies nocturnal pain.  Difficulty dressing/grooming: Denies Difficulty climbing stairs: Denies Difficulty getting out of chair: Denies Difficulty using hands for taps, buttons, cutlery, and/or writing: Denies  Review of Systems  Constitutional:  Positive for fatigue.  HENT:  Negative for mouth sores and mouth dryness.   Eyes:  Negative for dryness.  Respiratory:  Negative for shortness of breath.   Cardiovascular:  Negative for chest pain and palpitations.  Gastrointestinal:  Negative for blood in stool, constipation and diarrhea.  Endocrine: Negative for increased urination.  Genitourinary:  Negative for involuntary urination.  Musculoskeletal:  Negative for joint pain, gait problem, joint pain, joint swelling, myalgias, muscle weakness, morning stiffness, muscle tenderness and myalgias.  Skin:  Negative for color change, rash,  hair loss and sensitivity to sunlight.  Allergic/Immunologic: Negative for susceptible to infections.  Neurological:  Negative for dizziness and headaches.  Hematological:  Negative for swollen glands.  Psychiatric/Behavioral:  Negative for depressed mood and sleep disturbance. The patient is not nervous/anxious.     PMFS History:  Patient Active Problem List   Diagnosis Date Noted   Psoriatic arthropathy (HCC) 06/28/2018   Family history of psoriasis 06/28/2018   Smoker 06/28/2018   Sacroiliitis (H[facility]) 06/28/2018   Plaque psoriasis 12/02/2016   Social anxiety disorder 07/15/2011   History of depression 01/25/2011   Asthma, mild intermittent 01/25/2011    Past Medical History:  Diagnosis Date   Asthma    Depression    Psoriatic arthropathy (H[facility])     Family History  Problem Relation Age of Onset   Diabetes Mother        type ll   Heart disease Maternal Grandmother    Diabetes Maternal Grandmother        type ll   Healthy Brother    Healthy Son    Healthy Daughter    Past Surgical History:  Procedure Laterality Date   MOLE REMOVAL  2004   VASECTOMY     WISDOM TOOTH EXTRACTION     Social History   Social History Narrative   Not on file   Immunization History  Administered Date(s) Administered   Influenza,inj,Quad PF,6+ Mos 03/24/2019, 01/29/2020   Influenza-Unspecified 03/17/2016, 03/08/2017  PFIZER(Purple Top)SARS-COV-2 Vaccination 08/02/2019, 08/27/2019   Pneumococcal Polysaccharide-23 05/19/2021   Tdap 06/17/2014     Objective: Vital Signs: BP (!) 158/105 (BP Location: Left Arm, Patient Position: Sitting, Cuff Size: Large)   Pulse 86   Resp 17   Ht 5\' 10"  (1.778 m)   Wt 255 lb 3.2 oz (115.8 kg)   BMI 36.62 kg/m    Physical Exam Vitals and nursing note reviewed.  Constitutional:      Appearance: He is well-developed.  HENT:     Head: Normocephalic and atraumatic.  Eyes:     Conjunctiva/sclera: Conjunctivae normal.     Pupils: Pupils are equal,  round, and reactive to light.  Cardiovascular:     Rate and Rhythm: Normal rate and regular rhythm.     Heart sounds: Normal heart sounds.  Pulmonary:     Effort: Pulmonary effort is normal.     Breath sounds: Normal breath sounds.  Abdominal:     General: Bowel sounds are normal.     Palpations: Abdomen is soft.  Musculoskeletal:     Cervical back: Normal range of motion and neck supple.  Skin:    General: Skin is warm and dry.     Capillary Refill: Capillary refill takes less than 2 seconds.     Comments: Psoriasis plaques were noted over bilateral elbows.  He had right middle finger nail dystrophy.  Neurological:     Mental Status: He is alert and oriented to person, place, and time.  Psychiatric:        Behavior: Behavior normal.      Musculoskeletal Exam: C-spine was in good range of motion.  He had no tenderness over thoracic lumbar spine or SI joints.  Shoulder joints, elbow joints, wrist joints, MCPs PIPs and DIPs been good range of motion with no synovitis.  Hip joints in good range of motion.  Right knee was in a brace.  Left knee joint was in good range of motion with no warmth swelling or effusion.  There was no tenderness over ankles or MTPs.  There was no planter fasciitis or Achilles tendinitis.  CDAI Exam: CDAI Score: -- Patient Global: --; Provider Global: -- Swollen: --; Tender: -- Joint Exam 02/05/2022   No joint exam has been documented for this visit   There is currently no information documented on the homunculus. Go to the Rheumatology activity and complete the homunculus joint exam.  Investigation: No additional findings.  Imaging: No results found.  Recent Labs: Lab Results  Component Value Date   WBC 7.9 05/19/2021   HGB 15.4 05/19/2021   PLT 211.0 05/19/2021   NA 138 05/19/2021   K 4.8 05/19/2021   CL 102 05/19/2021   CO2 29 05/19/2021   GLUCOSE 97 05/19/2021   BUN 13 05/19/2021   CREATININE 1.05 05/19/2021   BILITOT 0.7 05/19/2021    ALKPHOS 69 05/19/2021   AST 24 05/19/2021   ALT 33 05/19/2021   PROT 7.3 05/19/2021   ALBUMIN 4.4 05/19/2021   CALCIUM 9.6 05/19/2021   GFRAA 109 06/16/2018   QFTBGOLDPLUS NEGATIVE 06/16/2018    Speciality Comments: No specialty comments available.  Procedures:  No procedures performed Allergies: Penicillins   Assessment / Plan:     Visit Diagnoses: Psoriatic arthropathy (HCC)-psoriatic arthritis is well controlled on Otezla 30 mg p.o. twice daily.  He had no synovitis on examination.  There is no history of Achilles tendinitis or planter fasciitis.  He had epicondylitis at the last visit which  resolved.  Plaque psoriasis-he has psoriasis lesions on his bilateral elbows.  He also had nail dystrophy especially in the right middle finger nail.  I did detailed discussion with the patient.  He is using topical agents intermittently.  I discussed the option of switching him to [facility] or [place] at the follow-up visit.  He would like to hold off currently.  High risk medication use - Otezla 30 mg 1 tablet by mouth twice daily (started in February 2020).  Labs obtained on May 19, 2021 were within normal limits.  Sacroiliitis (HCC) -he had no SI joint tenderness on the examination today.  He has intermittent discomfort in the SI joints.  X-rays were unremarkable on 07/28/20.   Injury of right knee-he fell and injured his right knee.  He states he had meniscal injury and a tendon tear.  He has been followed by orthopedic surgeon at [facility].  Family history of psoriasis  Elevated blood pressure reading-his blood pressure was 158/105.  He states he has been under a lot of stress.  I advised him to monitor blood pressure closely and follow-up with his PCP.  Increased risk of heart disease in patients with psoriatic arthritis was discussed.  History of asthma  History of depression  Social anxiety disorder  Smoker-smoking cessation was discussed with him.  Orders: No orders of the  defined types were placed in this encounter.  No orders of the defined types were placed in this encounter.    Follow-Up Instructions: Return in about 6 months (around 08/06/2022) for Psoriatic arthritis.   Pollyann Savoy, MD  Note - This record has been created using Animal nutritionist.  Chart creation errors have been sought, but may not always  have been located. Such creation errors do not reflect on  the standard of medical care.

## 2022-01-28 ENCOUNTER — Other Ambulatory Visit (HOSPITAL_COMMUNITY): Payer: Self-pay

## 2022-02-01 ENCOUNTER — Other Ambulatory Visit (HOSPITAL_COMMUNITY): Payer: Self-pay

## 2022-02-03 ENCOUNTER — Other Ambulatory Visit (HOSPITAL_COMMUNITY): Payer: Self-pay

## 2022-02-05 ENCOUNTER — Encounter: Payer: Self-pay | Admitting: Rheumatology

## 2022-02-05 ENCOUNTER — Ambulatory Visit: Payer: Managed Care, Other (non HMO) | Attending: Rheumatology | Admitting: Rheumatology

## 2022-02-05 VITALS — BP 158/105 | HR 86 | Resp 17 | Ht 70.0 in | Wt 255.2 lb

## 2022-02-05 DIAGNOSIS — L400 Psoriasis vulgaris: Secondary | ICD-10-CM | POA: Diagnosis not present

## 2022-02-05 DIAGNOSIS — Z8659 Personal history of other mental and behavioral disorders: Secondary | ICD-10-CM

## 2022-02-05 DIAGNOSIS — R030 Elevated blood-pressure reading, without diagnosis of hypertension: Secondary | ICD-10-CM

## 2022-02-05 DIAGNOSIS — F4010 Social phobia, unspecified: Secondary | ICD-10-CM

## 2022-02-05 DIAGNOSIS — Z79899 Other long term (current) drug therapy: Secondary | ICD-10-CM

## 2022-02-05 DIAGNOSIS — M7712 Lateral epicondylitis, left elbow: Secondary | ICD-10-CM

## 2022-02-05 DIAGNOSIS — L4050 Arthropathic psoriasis, unspecified: Secondary | ICD-10-CM

## 2022-02-05 DIAGNOSIS — S8991XS Unspecified injury of right lower leg, sequela: Secondary | ICD-10-CM

## 2022-02-05 DIAGNOSIS — Z840 Family history of diseases of the skin and subcutaneous tissue: Secondary | ICD-10-CM

## 2022-02-05 DIAGNOSIS — M461 Sacroiliitis, not elsewhere classified: Secondary | ICD-10-CM

## 2022-02-05 DIAGNOSIS — F17200 Nicotine dependence, unspecified, uncomplicated: Secondary | ICD-10-CM

## 2022-02-05 DIAGNOSIS — Z8709 Personal history of other diseases of the respiratory system: Secondary | ICD-10-CM

## 2022-02-26 ENCOUNTER — Other Ambulatory Visit (HOSPITAL_COMMUNITY): Payer: Self-pay

## 2022-03-05 ENCOUNTER — Telehealth: Payer: Self-pay | Admitting: Pharmacist

## 2022-03-05 NOTE — Telephone Encounter (Signed)
Received notification from [facility] that patient has not returned calls regarding Otezla rx. Last filled in August 2023 and patient had filled in September 2023 when he said he was taking as prescribed. Left VM For pt. MyChart message also sent today.  Knox Saliva, PharmD, MPH, BCPS, CPP Clinical Pharmacist (Rheumatology and Pulmonology)

## 2022-03-08 ENCOUNTER — Other Ambulatory Visit (HOSPITAL_COMMUNITY): Payer: Self-pay

## 2022-03-11 ENCOUNTER — Ambulatory Visit: Payer: Managed Care, Other (non HMO) | Admitting: Family Medicine

## 2022-03-11 VITALS — BP 142/92 | HR 84 | Temp 98.8°F | Wt 252.8 lb

## 2022-03-11 DIAGNOSIS — I10 Essential (primary) hypertension: Secondary | ICD-10-CM | POA: Diagnosis not present

## 2022-03-11 DIAGNOSIS — R051 Acute cough: Secondary | ICD-10-CM | POA: Diagnosis not present

## 2022-03-11 DIAGNOSIS — J069 Acute upper respiratory infection, unspecified: Secondary | ICD-10-CM | POA: Diagnosis not present

## 2022-03-11 DIAGNOSIS — H66003 Acute suppurative otitis media without spontaneous rupture of ear drum, bilateral: Secondary | ICD-10-CM | POA: Diagnosis not present

## 2022-03-11 MED ORDER — DOXYCYCLINE HYCLATE 100 MG PO TABS: 100.0000 mg | ORAL_TABLET | Freq: Two times a day (BID) | ORAL | 0 refills | Status: AC

## 2022-03-11 NOTE — Progress Notes (Signed)
Subjective:    Patient ID: Donald Pearson, male    DOB: Feb 11, 1976, 46 y.o.   MRN: [id number]  Chief Complaint  Patient presents with   Ear Pain    Feeling clogged up for almost 1 week. Zyrtec D, mucinex, has helped a little but has not cleared it up. States it is sinus issues.    HPI Patient is a 46 year old male w/ pmh sig HTN, plaque psoriasis, psoriatic arthritis, sacroiliitis, mild asthma  followed by Dr. Elease Hashimoto who was seen today for acute concern.  Patient endorses cough x1 week with congestion, sore throat, rhinorrhea, headache, ears feeling clogged.  Patient tried Mucinex and Zyrtec-D for symptoms.  Using inhaler.  States got sick from his school-aged son.  Patient is a cigarette smoker.  Past Medical History:  Diagnosis Date   Asthma    Depression    Psoriatic arthropathy (HCC)     Allergies  Allergen Reactions   Penicillins     As a child, rash    ROS General: Denies fever, chills, night sweats, changes in weight, changes in appetite HEENT: Denies ear pain, changes in vision +rhinorrhea, sore throat, HA, clogged ears, congestion CV: Denies CP, palpitations, SOB, orthopnea Pulm: Denies SOB, wheezing  + cough GI: Denies abdominal pain, nausea, vomiting, diarrhea, constipation GU: Denies dysuria, hematuria, frequency, Msk: Denies muscle cramps, joint pains Neuro: Denies weakness, numbness, tingling Skin: Denies rashes, bruising Psych: Denies depression, anxiety, hallucinations     Objective:    Blood pressure (!) 142/92, pulse 84, temperature 98.8 F (37.1 C), temperature source Oral, weight 252 lb 12.8 oz (114.7 kg), SpO2 94 %.  Gen. Pleasant, well-nourished, in no distress, normal affect   HEENT: [place]/AT, face symmetric, conjunctiva clear, no scleral icterus, PERRLA, EOMI, nares patent without drainage, pharynx with erythema, no exudate.  Left TM full with erythema and suppurative fluid. right TM worse than left with retraction, erythema, suppurative fluid.  No  cervical lymphadenopathy. Lungs: no accessory muscle use, CTAB, no wheezes or rales Cardiovascular: RRR, no m/r/g, no peripheral edema Musculoskeletal: No deformities, no cyanosis or clubbing, normal tone Neuro:  A&Ox3, CN II-XII intact, normal gait Skin:  Warm, no lesions/ rash   Wt Readings from Last 3 Encounters:  03/11/22 252 lb 12.8 oz (114.7 kg)  02/05/22 255 lb 3.2 oz (115.8 kg)  09/01/21 255 lb (115.7 kg)    Lab Results  Component Value Date   WBC 7.9 05/19/2021   HGB 15.4 05/19/2021   HCT 44.8 05/19/2021   PLT 211.0 05/19/2021   GLUCOSE 97 05/19/2021   CHOL 195 05/19/2021   TRIG 160.0 (H) 05/19/2021   HDL 41.40 05/19/2021   LDLCALC 122 (H) 05/19/2021   ALT 33 05/19/2021   AST 24 05/19/2021   NA 138 05/19/2021   K 4.8 05/19/2021   CL 102 05/19/2021   CREATININE 1.05 05/19/2021   BUN 13 05/19/2021   CO2 29 05/19/2021   TSH 1.63 05/19/2021   HGBA1C 5.6 05/19/2021    Assessment/Plan:  Acute suppurative otitis media of both ears without spontaneous rupture of tympanic membranes, recurrence not specified -Start ABX -Penicillin allergy as a child -Tylenol or NSAIDs as needed for pain/discomfort -Follow-up as needed -Given handout  - Plan: doxycycline (VIBRA-TABS) 100 MG tablet  Acute URI -Supportive care with OTC cough/cold medications, rest, hydration, gargling with warm salt water or chloraseptic spray.  Acute cough -Albuterol inhaler as needed for wheezing -OTC supportive care with medications for people with high blood pressure  Essential hypertension -Elevated -  Advised to refrain from taking cough/cold medication with decongestant -Advised to look for OTC cough/cold medication for people with high blood pressure -Continue current medications  F/u prn  Abbe Amsterdam, MD

## 2022-03-15 ENCOUNTER — Other Ambulatory Visit (HOSPITAL_COMMUNITY): Payer: Self-pay

## 2022-03-17 ENCOUNTER — Other Ambulatory Visit (HOSPITAL_COMMUNITY): Payer: Self-pay

## 2022-03-18 ENCOUNTER — Other Ambulatory Visit (HOSPITAL_COMMUNITY): Payer: Self-pay

## 2022-03-23 ENCOUNTER — Telehealth: Payer: Self-pay | Admitting: Family Medicine

## 2022-03-23 ENCOUNTER — Other Ambulatory Visit (HOSPITAL_COMMUNITY): Payer: Self-pay

## 2022-03-23 MED ORDER — ESCITALOPRAM OXALATE 10 MG PO TABS: 10.0000 mg | ORAL_TABLET | Freq: Every day | ORAL | 0 refills | Status: DC

## 2022-03-23 NOTE — Telephone Encounter (Signed)
Rx sent 

## 2022-03-23 NOTE — Telephone Encounter (Addendum)
Pt called to request a refill of the: escitalopram (LEXAPRO) 10 MG tablet   LOV:  03/11/22 = Acute (Ear pain)  [place], [place] Phone: [phone]  Fax: [phone]

## 2022-03-24 ENCOUNTER — Other Ambulatory Visit: Payer: Self-pay | Admitting: Rheumatology

## 2022-03-24 ENCOUNTER — Other Ambulatory Visit (HOSPITAL_COMMUNITY): Payer: Self-pay

## 2022-03-24 DIAGNOSIS — L400 Psoriasis vulgaris: Secondary | ICD-10-CM

## 2022-03-24 MED ORDER — OTEZLA 30 MG PO TABS
1.0000 | ORAL_TABLET | Freq: Two times a day (BID) | ORAL | 0 refills | Status: DC
  Filled 2022-03-24: qty 60, 30d supply, fill #0
  Filled 2022-04-26 – 2022-04-30 (×3): qty 60, 30d supply, fill #1
  Filled 2022-05-28: qty 60, 30d supply, fill #2

## 2022-03-24 NOTE — Telephone Encounter (Signed)
Next Visit: 05/04/2022  Last Visit: 02/05/2022  Last Fill: 12/16/2021  DX:  Psoriatic arthropathy   Current Dose per office note 02/05/2022: [place] 30 mg p.o. twice daily   Labs: 05/19/2021 WNL  Okay to refill [place]?

## 2022-04-02 ENCOUNTER — Telehealth: Payer: Self-pay | Admitting: Pharmacist

## 2022-04-02 NOTE — Telephone Encounter (Signed)
Submitted a Prior Authorization renewal request to [employer] for OTEZLA via CoverMyMeds. Will update once we receive a response.  Key: [id number]  Chesley Mires, PharmD, MPH, BCPS, CPP Clinical Pharmacist (Rheumatology and Pulmonology)

## 2022-04-02 NOTE — Telephone Encounter (Signed)
Per automated response: Drug is covered by current benefit plan. No further PA activity needed  Therigy updated.  Chesley Mires, PharmD, MPH, BCPS, CPP Clinical Pharmacist (Rheumatology and Pulmonology)

## 2022-04-05 ENCOUNTER — Other Ambulatory Visit (HOSPITAL_COMMUNITY): Payer: Self-pay

## 2022-04-11 ENCOUNTER — Other Ambulatory Visit: Payer: Self-pay | Admitting: Family Medicine

## 2022-04-11 DIAGNOSIS — J4520 Mild intermittent asthma, uncomplicated: Secondary | ICD-10-CM

## 2022-04-11 DIAGNOSIS — J0100 Acute maxillary sinusitis, unspecified: Secondary | ICD-10-CM

## 2022-04-13 ENCOUNTER — Other Ambulatory Visit (HOSPITAL_COMMUNITY): Payer: Self-pay

## 2022-04-13 MED ORDER — ALBUTEROL SULFATE HFA 108 (90 BASE) MCG/ACT IN AERS
2.0000 | INHALATION_SPRAY | RESPIRATORY_TRACT | 0 refills | Status: DC | PRN
  Filled 2022-04-13: qty 6.7, 17d supply, fill #0

## 2022-04-16 ENCOUNTER — Other Ambulatory Visit: Payer: Self-pay

## 2022-04-16 DIAGNOSIS — J0100 Acute maxillary sinusitis, unspecified: Secondary | ICD-10-CM

## 2022-04-16 DIAGNOSIS — J4520 Mild intermittent asthma, uncomplicated: Secondary | ICD-10-CM

## 2022-04-16 MED ORDER — ALBUTEROL SULFATE HFA 108 (90 BASE) MCG/ACT IN AERS: 2.0000 | INHALATION_SPRAY | RESPIRATORY_TRACT | 0 refills | Status: AC | PRN

## 2022-04-22 NOTE — Progress Notes (Unsigned)
Office Visit Note  Patient: Donald Pearson             Date of Birth: 07/25/1975           MRN: [id number]             PCP: Kristian Covey, MD Referring: Kristian Covey, MD Visit Date: 05/04/2022 Occupation: @GUAROCC @  Subjective:  Right knee pain   History of Present Illness: Donald Pearson is a 46 y.o. male with history of psoriatic arthritis.  He is currently taking Otezla 30 mg 1 tablet by mouth twice daily.  He denies any signs or symptoms of a psoriatic arthritis flare recently.  He continues to experience recurrent right knee effusions due to a previous injury.  He had a recent right knee aspiration by his orthopedist at [facility].  His right knee joint pain has improved after having a cortisone injection.  Patient reports that he is considering proceeding with right knee arthroscopic surgery and meniscal repair in February 2024 if his symptoms persist or worsen.  He denies any discomfort in his left knee joint at this time.  He has occasional discomfort in his right SI joint.  He denies any nocturnal pain or stiffness in his lower back at this time.  He denies any Achilles tendinitis or plantar fasciitis.  He has occasional patches of psoriasis on his elbows and knees and noticed a new small patch on his left lower leg.  He typically uses clobetasol ointment as needed.  He requested a refill of clobetasol to be sent to the pharmacy today.  He denies any new medical conditions.  He denies any recent or recurrent infections.    Activities of Daily Living:  Patient reports morning stiffness for a few minutes.   Patient Reports nocturnal pain.  Difficulty dressing/grooming: Denies Difficulty climbing stairs: Denies Difficulty getting out of chair: Denies Difficulty using hands for taps, buttons, cutlery, and/or writing: Denies  Review of Systems  Constitutional:  Positive for fatigue.  HENT:  Negative for mouth sores and mouth dryness.   Eyes:  Negative for dryness.   Respiratory:  Negative for shortness of breath.   Cardiovascular:  Negative for chest pain and palpitations.  Gastrointestinal:  Negative for blood in stool, constipation and diarrhea.  Endocrine: Negative for increased urination.  Genitourinary:  Negative for involuntary urination.  Musculoskeletal:  Positive for joint pain, joint pain, joint swelling and morning stiffness. Negative for gait problem, myalgias, muscle weakness, muscle tenderness and myalgias.  Skin:  Negative for color change, rash, hair loss and sensitivity to sunlight.  Allergic/Immunologic: Negative for susceptible to infections.  Neurological:  Negative for dizziness and headaches.  Hematological:  Negative for swollen glands.  Psychiatric/Behavioral:  Negative for depressed mood and sleep disturbance. The patient is not nervous/anxious.     PMFS History:  Patient Active Problem List   Diagnosis Date Noted   Psoriatic arthropathy (HCC) 06/28/2018   Family history of psoriasis 06/28/2018   Smoker 06/28/2018   Sacroiliitis (HCC) 06/28/2018   Plaque psoriasis 12/02/2016   Social anxiety disorder 07/15/2011   History of depression 01/25/2011   Asthma, mild intermittent 01/25/2011    Past Medical History:  Diagnosis Date   Asthma    Depression    Psoriatic arthropathy (HCC)     Family History  Problem Relation Age of Onset   Diabetes Mother        type ll   Heart disease Maternal Grandmother    Diabetes Maternal Grandmother  type ll   Healthy Brother    Healthy Son    Healthy Daughter    Past Surgical History:  Procedure Laterality Date   MOLE REMOVAL  2004   VASECTOMY     WISDOM TOOTH EXTRACTION     Social History   Social History Narrative   Not on file   Immunization History  Administered Date(s) Administered   Influenza,inj,Quad PF,6+ Mos 03/24/2019, 01/29/2020   Influenza-Unspecified 03/17/2016, 03/08/2017   PFIZER(Purple Top)SARS-COV-2 Vaccination 08/02/2019, 08/27/2019    Pneumococcal Polysaccharide-23 05/19/2021   Tdap 06/17/2014     Objective: Vital Signs: BP (!) 133/90 (BP Location: Left Arm, Patient Position: Sitting, Cuff Size: Large)   Pulse 98   Resp 18   Ht 5\' 10"  (1.778 m)   Wt 257 lb (116.6 kg)   BMI 36.88 kg/m    Physical Exam Vitals and nursing note reviewed.  Constitutional:      Appearance: He is well-developed.  HENT:     Head: Normocephalic and atraumatic.  Eyes:     Conjunctiva/sclera: Conjunctivae normal.     Pupils: Pupils are equal, round, and reactive to light.  Cardiovascular:     Rate and Rhythm: Normal rate and regular rhythm.     Heart sounds: Normal heart sounds.  Pulmonary:     Effort: Pulmonary effort is normal.     Breath sounds: Normal breath sounds.  Abdominal:     General: Bowel sounds are normal.     Palpations: Abdomen is soft.  Musculoskeletal:     Cervical back: Normal range of motion and neck supple.  Skin:    General: Skin is warm and dry.     Capillary Refill: Capillary refill takes less than 2 seconds.     Comments: Small patches of plaque psoriasis on extensor surface of both knees. 1 small patch on anterior surface of right lower leg.   Neurological:     Mental Status: He is alert and oriented to person, place, and time.  Psychiatric:        Behavior: Behavior normal.      Musculoskeletal Exam: C-spine, thoracic spine, lumbar spine have good range of motion.  No midline spinal tenderness.  Mild right SI joint tenderness upon palpation.  Shoulder joints, elbow joints, wrist joints, MCPs, PIPs, DIPs have good range of motion with no synovitis.  Complete fist formation bilaterally.  Hip joints have good range of motion with no groin pain.  Right knee joint has a small effusion.  Left knee joint has good range of motion with no warmth or effusion.  Ankle joints have good range of motion with no tenderness or synovitis.  No evidence of Achilles tendinitis.  CDAI Exam: CDAI Score: -- Patient Global:  --; Provider Global: -- Swollen: --; Tender: -- Joint Exam 05/04/2022   No joint exam has been documented for this visit   There is currently no information documented on the homunculus. Go to the Rheumatology activity and complete the homunculus joint exam.  Investigation: No additional findings.  Imaging: No results found.  Recent Labs: Lab Results  Component Value Date   WBC 7.9 05/19/2021   HGB 15.4 05/19/2021   PLT 211.0 05/19/2021   NA 138 05/19/2021   K 4.8 05/19/2021   CL 102 05/19/2021   CO2 29 05/19/2021   GLUCOSE 97 05/19/2021   BUN 13 05/19/2021   CREATININE 1.05 05/19/2021   BILITOT 0.7 05/19/2021   ALKPHOS 69 05/19/2021   AST 24 05/19/2021   ALT 33  05/19/2021   PROT 7.3 05/19/2021   ALBUMIN 4.4 05/19/2021   CALCIUM 9.6 05/19/2021   GFRAA 109 06/16/2018   QFTBGOLDPLUS NEGATIVE 06/16/2018    Speciality Comments: No specialty comments available.  Procedures:  No procedures performed Allergies: Penicillins   Assessment / Plan:     Visit Diagnoses: Psoriatic arthropathy (HCC): He has no synovitis or dactylitis on examination today.  He has not had any signs or symptoms of a psoriatic arthritis flare.  No evidence of Achilles tendinitis or plantar fasciitis.  Intermittent right SI joint pain-mild-no nocturnal symptoms.  He has clinically been doing well taking Otezla 30 mg 1 tablet by mouth twice daily.  He is tolerating Otezla without any side effects and has not missed any doses recently.  He continues to have small patches of plaque psoriasis on extensor surface of both elbows.  A prescription for clobetasol cream was sent to the pharmacy today which she can apply it up to twice daily for 2 weeks.  He will remain on Otezla as prescribed.  If his psoriasis persists or worsens or he develops more consistent or severe right SI joint pain he will notify us at which time we can discuss combination therapy or other treatment options which may be more effective.  He  was advised to notify us if he develops signs or symptoms of a flare.  He will follow up in 5 months or sooner if needed.   Plaque psoriasis: Extensor surface of both elbows and anterior surface of both knees.  1 small patch noted on the anterior surface of his left lower leg.  Patient was given a prescription for clobetasol cream which she can apply topically twice daily for up to 2 weeks.  He will remain on Otezla as prescribed.  He was advised to notify us if his psoriasis persists or worsens at which time we can discuss combination therapy or other treatment options in the future.  High risk medication use - Otezla 30 mg 1 tablet by mouth twice daily (started in February 2020).   CBC, BMP, hepatic function WNL on 05/19/21.  Orders for CBC and CMP released today.  He will be having updated lab work in January 2024 and will have results forwarded to our office to review.  Discussed that ideally he should have lab work every 6 months to monitor for drug toxicity. He has not had any recent or recurrent infections.  Sacroiliitis (HCC) - X-rays were unremarkable on 07/28/20.  He experiences intermittent right SI joint pain.  On examination he has mild tenderness upon palpation.  He has not been experiencing nocturnal pain or increased morning stiffness in his lower back.  Discussed that if his right SI joint pain persists or worsens he can return for right SI joint cortisone injection.  If he continues to have persistent discomfort we can also discuss other treatment options that will be more effective for axial skeleton involvement.  Injury of right knee, sequela - Fall-meniscal injury and a tendon tear.  He has been followed by orthopedic surgeon at [facility].  He continues to experience recurrent right knee joint effusions.  He had a right knee joint aspiration and cortisone injection performed about 10 days ago which alleviated his symptoms.  He continues to have a small effusion in the right knee joint  on examination today.  He is planning to proceed with right knee arthroscopic surgery in February 2024 if his symptoms persist or worsen.  Other medical conditions are listed as  follows:   Family history of psoriasis  History of asthma  Social anxiety disorder  History of depression  Smoker  Orders: No orders of the defined types were placed in this encounter.  No orders of the defined types were placed in this encounter.    Follow-Up Instructions: Return in about 5 months (around 10/03/2022) for Psoriatic arthritis.   Gearldine Bienenstock, PA-C  Note - This record has been created using Dragon software.  Chart creation errors have been sought, but may not always  have been located. Such creation errors do not reflect on  the standard of medical care.

## 2022-04-26 ENCOUNTER — Other Ambulatory Visit (HOSPITAL_COMMUNITY): Payer: Self-pay

## 2022-04-30 ENCOUNTER — Other Ambulatory Visit (HOSPITAL_COMMUNITY): Payer: Self-pay

## 2022-04-30 ENCOUNTER — Other Ambulatory Visit: Payer: Self-pay

## 2022-05-03 ENCOUNTER — Other Ambulatory Visit: Payer: Self-pay

## 2022-05-04 ENCOUNTER — Encounter: Payer: Self-pay | Admitting: Physician Assistant

## 2022-05-04 ENCOUNTER — Ambulatory Visit: Payer: Managed Care, Other (non HMO) | Attending: Physician Assistant | Admitting: Physician Assistant

## 2022-05-04 ENCOUNTER — Other Ambulatory Visit: Payer: Self-pay

## 2022-05-04 VITALS — BP 133/90 | HR 98 | Resp 18 | Ht 70.0 in | Wt 257.0 lb

## 2022-05-04 DIAGNOSIS — S8991XS Unspecified injury of right lower leg, sequela: Secondary | ICD-10-CM

## 2022-05-04 DIAGNOSIS — L4050 Arthropathic psoriasis, unspecified: Secondary | ICD-10-CM

## 2022-05-04 DIAGNOSIS — M461 Sacroiliitis, not elsewhere classified: Secondary | ICD-10-CM | POA: Diagnosis not present

## 2022-05-04 DIAGNOSIS — F4010 Social phobia, unspecified: Secondary | ICD-10-CM

## 2022-05-04 DIAGNOSIS — Z840 Family history of diseases of the skin and subcutaneous tissue: Secondary | ICD-10-CM

## 2022-05-04 DIAGNOSIS — L400 Psoriasis vulgaris: Secondary | ICD-10-CM | POA: Diagnosis not present

## 2022-05-04 DIAGNOSIS — Z8709 Personal history of other diseases of the respiratory system: Secondary | ICD-10-CM

## 2022-05-04 DIAGNOSIS — Z79899 Other long term (current) drug therapy: Secondary | ICD-10-CM | POA: Diagnosis not present

## 2022-05-04 DIAGNOSIS — F17200 Nicotine dependence, unspecified, uncomplicated: Secondary | ICD-10-CM

## 2022-05-04 DIAGNOSIS — Z8659 Personal history of other mental and behavioral disorders: Secondary | ICD-10-CM

## 2022-05-04 MED ORDER — CLOBETASOL PROPIONATE 0.05 % EX CREA: 1.0000 | TOPICAL_CREAM | Freq: Two times a day (BID) | CUTANEOUS | 1 refills | Status: DC

## 2022-05-04 NOTE — Telephone Encounter (Signed)
Please review and sign pended clobetasol cream rx. Thanks!

## 2022-05-26 ENCOUNTER — Other Ambulatory Visit (HOSPITAL_COMMUNITY): Payer: Self-pay

## 2022-05-28 ENCOUNTER — Other Ambulatory Visit (HOSPITAL_COMMUNITY): Payer: Self-pay

## 2022-05-29 ENCOUNTER — Other Ambulatory Visit: Payer: Self-pay | Admitting: Family Medicine

## 2022-06-04 ENCOUNTER — Other Ambulatory Visit (HOSPITAL_COMMUNITY): Payer: Self-pay

## 2022-06-07 ENCOUNTER — Other Ambulatory Visit: Payer: Self-pay | Admitting: Family Medicine

## 2022-07-06 ENCOUNTER — Other Ambulatory Visit: Payer: Self-pay

## 2022-07-06 ENCOUNTER — Encounter: Payer: Self-pay | Admitting: *Deleted

## 2022-07-06 ENCOUNTER — Other Ambulatory Visit: Payer: Self-pay | Admitting: Rheumatology

## 2022-07-06 ENCOUNTER — Other Ambulatory Visit (HOSPITAL_COMMUNITY): Payer: Self-pay

## 2022-07-06 DIAGNOSIS — L400 Psoriasis vulgaris: Secondary | ICD-10-CM

## 2022-07-06 MED ORDER — OTEZLA 30 MG PO TABS
1.0000 | ORAL_TABLET | Freq: Two times a day (BID) | ORAL | 0 refills | Status: DC
  Filled 2022-07-06: qty 60, 30d supply, fill #0

## 2022-07-06 NOTE — Telephone Encounter (Signed)
Next Visit: 10/05/2022  Last Visit: 05/04/2022  Last Fill: 03/24/2022  DX: Psoriatic arthropathy   Current Dose per office note 05/04/2022: Rutherford Nail 30 mg 1 tablet by mouth twice daily   Labs: 05/19/2021 WNL  Sent message via my chart to advise patient he is due to update labs   Okay to refill Rutherford Nail?

## 2022-07-08 ENCOUNTER — Other Ambulatory Visit (HOSPITAL_COMMUNITY): Payer: Self-pay

## 2022-07-09 ENCOUNTER — Other Ambulatory Visit (HOSPITAL_COMMUNITY): Payer: Self-pay

## 2022-07-13 ENCOUNTER — Other Ambulatory Visit (HOSPITAL_COMMUNITY): Payer: Self-pay

## 2022-08-04 ENCOUNTER — Ambulatory Visit: Payer: Managed Care, Other (non HMO) | Admitting: Family Medicine

## 2022-08-04 ENCOUNTER — Encounter: Payer: Self-pay | Admitting: Family Medicine

## 2022-08-04 VITALS — BP 134/80 | HR 87 | Temp 98.1°F | Ht 70.0 in | Wt 255.1 lb

## 2022-08-04 DIAGNOSIS — F4010 Social phobia, unspecified: Secondary | ICD-10-CM | POA: Diagnosis not present

## 2022-08-04 MED ORDER — ESCITALOPRAM OXALATE 10 MG PO TABS: 10.0000 mg | ORAL_TABLET | Freq: Every day | ORAL | 3 refills | Status: DC

## 2022-08-04 NOTE — Progress Notes (Signed)
Established Patient Office Visit  Subjective   Patient ID: Donald Pearson, male    DOB: [birth year]  Age: 47 y.o. MRN: [id number]  Chief Complaint  Patient presents with   Medication Refill    HPI   Concepcion is seen for medical follow-up for medication refill.  He takes Lexapro 10 mg daily for social anxiety disorder predominantly.  He has had some depression in the past and still has occasional mild depression symptoms but overall fairly stable.  He did recently quit smoking just couple weeks ago.  He feels like Lexapro still works well for his social anxiety.  Denies any side effects.  Has been on this for several years.  Medical problems include history of plaque psoriasis and psoriatic arthritis.  He is currently on Otezla and followed by rheumatology.  He had some sacroiliitis in the past.  He had physical last year.  He states he has upcoming scheduled work physical.  He plans to get some labs through that.  Past Medical History:  Diagnosis Date   Asthma    Depression    Psoriatic arthropathy ([place])    Past Surgical History:  Procedure Laterality Date   MOLE REMOVAL  2004   VASECTOMY     WISDOM TOOTH EXTRACTION      reports that he has been smoking cigarettes. He started smoking about 18 years ago. He has been smoking an average of .3 packs per day. He has never been exposed to tobacco smoke. He has never used smokeless tobacco. He reports current alcohol use. He reports that he does not use drugs. family history includes Diabetes in his maternal grandmother and mother; Healthy in his brother, daughter, and son; Heart disease in his maternal grandmother. Allergies  Allergen Reactions   Penicillins     As a child, rash     Review of Systems  Constitutional:  Negative for weight loss.  Cardiovascular:  Negative for chest pain.  Neurological:  Negative for headaches.  Psychiatric/Behavioral:  Negative for suicidal ideas. The patient is nervous/anxious.       Objective:      BP 134/80 (BP Location: Left Arm, Patient Position: Sitting, Cuff Size: Large)   Pulse 87   Temp 98.1 F (36.7 C) (Oral)   Ht 5\' 10"  (1.778 m)   Wt 255 lb 1.6 oz (115.7 kg)   SpO2 98%   BMI 36.60 kg/m  BP Readings from Last 3 Encounters:  08/04/22 134/80  05/04/22 (!) 133/90  03/11/22 (!) 142/92   Wt Readings from Last 3 Encounters:  08/04/22 255 lb 1.6 oz (115.7 kg)  05/04/22 257 lb (116.6 kg)  03/11/22 252 lb 12.8 oz (114.7 kg)      Physical Exam Vitals reviewed.  Constitutional:      Appearance: Normal appearance.  Cardiovascular:     Rate and Rhythm: Normal rate and regular rhythm.     Heart sounds: No murmur heard.    No gallop.  Pulmonary:     Effort: Pulmonary effort is normal.     Breath sounds: Normal breath sounds. No wheezing or rales.  Neurological:     General: No focal deficit present.     Mental Status: He is alert.  Psychiatric:        Mood and Affect: Mood normal.        Thought Content: Thought content normal.      No results found for any visits on 08/04/22.    The 10-year ASCVD risk score (Arnett DK, et  al., 2019) is: 6.6%    Assessment & Plan:   Problem List Items Addressed This Visit       Unprioritized   Social anxiety disorder - Primary   Relevant Medications   escitalopram (LEXAPRO) 10 MG tablet  Social anxiety disorder stable on Lexapro 10 mg daily.  Refill medication for 1 year.  He denies any active depression symptoms currently.  -We have suggested setting up complete physical at some point in the next year.  Follow-up in 1 year and sooner as needed  No follow-ups on file.    Carolann Littler, MD

## 2022-08-10 ENCOUNTER — Other Ambulatory Visit (HOSPITAL_COMMUNITY): Payer: Self-pay

## 2022-08-12 ENCOUNTER — Other Ambulatory Visit: Payer: Self-pay

## 2022-08-13 ENCOUNTER — Other Ambulatory Visit (HOSPITAL_COMMUNITY): Payer: Self-pay

## 2022-08-17 ENCOUNTER — Encounter: Payer: Self-pay | Admitting: Family

## 2022-08-17 ENCOUNTER — Ambulatory Visit: Payer: Managed Care, Other (non HMO) | Admitting: Family

## 2022-08-17 VITALS — BP 144/86 | HR 85 | Temp 99.0°F | Ht 70.0 in | Wt 249.5 lb

## 2022-08-17 DIAGNOSIS — J069 Acute upper respiratory infection, unspecified: Secondary | ICD-10-CM | POA: Diagnosis not present

## 2022-08-17 DIAGNOSIS — R051 Acute cough: Secondary | ICD-10-CM | POA: Diagnosis not present

## 2022-08-17 LAB — POC COVID19 BINAXNOW: SARS Coronavirus 2 Ag: NEGATIVE

## 2022-08-17 LAB — POCT INFLUENZA A/B
Influenza A, POC: NEGATIVE
Influenza B, POC: NEGATIVE

## 2022-08-17 MED ORDER — PROMETHAZINE-DM 6.25-15 MG/5ML PO SYRP: 5.0000 mL | ORAL_SOLUTION | Freq: Four times a day (QID) | ORAL | 0 refills | Status: DC | PRN

## 2022-08-18 NOTE — Progress Notes (Signed)
Acute Office Visit  Subjective:     Patient ID: Donald Pearson, male    DOB: 06/07/1975, 47 y.o.   MRN: [id number]  Chief Complaint  Patient presents with  . Cough    Patient complains of cough, x6 days, Tried Zyrtec and Mucinex with little relief   . Sinusitis    Patient complains of sinusitis, x6 days, Tried Zyertec and Mucinex with little relief     HPI Patient is in today with c/o cough, congestion x 6 days. Has been taking Zyrtec and Mucinex that has helped. Reports family members who have also been ill but recovered. No fever or chills.   Review of Systems  Constitutional:  Negative for chills and fever.  HENT:  Positive for congestion and sore throat.   Eyes: Negative.   Respiratory:  Positive for cough.   Cardiovascular: Negative.   Genitourinary: Negative.   Musculoskeletal: Negative.   Neurological: Negative.   Psychiatric/Behavioral: Negative.    All other systems reviewed and are negative.  Past Medical History:  Diagnosis Date  . Asthma   . Depression   . Psoriatic arthropathy     Social History   Socioeconomic History  . Marital status: Married    Spouse name: Not on file  . Number of children: Not on file  . Years of education: Not on file  . Highest education level: Bachelor's degree (e.g., BA, AB, BS)  Occupational History  . Not on file  Tobacco Use  . Smoking status: Every Day    Packs/day: .3    Types: Cigarettes    Start date: 2006    Passive exposure: Never  . Smokeless tobacco: Never  . Tobacco comments:    smokes 1/4 pack per day 09/15/2020  Vaping Use  . Vaping Use: Former  Substance and Sexual Activity  . Alcohol use: Yes    Comment: daily   . Drug use: No  . Sexual activity: Not on file  Other Topics Concern  . Not on file  Social History Narrative  . Not on file   Social Determinants of Health   Financial Resource Strain: Not on file  Food Insecurity: No Food Insecurity (03/11/2022)   Hunger Vital Sign   . Worried  About Charity fundraiser in the Last Year: Never true   . Ran Out of Food in the Last Year: Never true  Transportation Needs: No Transportation Needs (03/11/2022)   PRAPARE - Transportation   . Lack of Transportation (Medical): No   . Lack of Transportation (Non-Medical): No  Physical Activity: Unknown (03/11/2022)   Exercise Vital Sign   . Days of Exercise per Week: Patient declined   . Minutes of Exercise per Session: Not on file  Stress: No Stress Concern Present (03/11/2022)   [place]   . Feeling of Stress : Only a little  Social Connections: Socially Isolated (03/11/2022)   Social Connection and Isolation Panel [NHANES]   . Frequency of Communication with Friends and Family: Once a week   . Frequency of Social Gatherings with Friends and Family: Never   . Attends Religious Services: Never   . Active Member of Clubs or Organizations: No   . Attends Archivist Meetings: Not on file   . Marital Status: Married  Human resources officer Violence: Not on file    Past Surgical History:  Procedure Laterality Date  . MOLE REMOVAL  2004  . VASECTOMY    . WISDOM  TOOTH EXTRACTION      Family History  Problem Relation Age of Onset  . Diabetes Mother        type ll  . Heart disease Maternal Grandmother   . Diabetes Maternal Grandmother        type ll  . Healthy Brother   . Healthy Son   . Healthy Daughter     Allergies  Allergen Reactions  . Penicillins     As a child, rash    Current Outpatient Medications on File Prior to Visit  Medication Sig Dispense Refill  . albuterol (VENTOLIN HFA) 108 (90 Base) MCG/ACT inhaler Inhale 2 puffs into the lungs every 4 (four) hours as needed for wheezing. 6.7 g 0  . Apremilast (OTEZLA) 30 MG TABS TAKE 1 TABLET BY MOUTH 2 TIMES DAILY. 60 tablet 0  . cetirizine (ZYRTEC) 10 MG tablet Take 10 mg by mouth daily.    . cetirizine-pseudoephedrine (ZYRTEC-D) 5-120 MG  tablet Take 1 tablet by mouth 2 (two) times daily.    . clobetasol cream (TEMOVATE) [id number] % Apply 1 Application topically 2 (two) times daily. 30 g 1  . escitalopram (LEXAPRO) 10 MG tablet Take 1 tablet (10 mg total) by mouth daily. 90 tablet 3  . Multiple Vitamin (MULTIVITAMIN PO) Take by mouth daily.    . naproxen sodium (ALEVE) 220 MG tablet Take 220 mg by mouth as needed.      No current facility-administered medications on file prior to visit.    BP (!) 144/86 (BP Location: Left Arm, Patient Position: Sitting, Cuff Size: Large)   Pulse 85   Temp 99 F (37.2 C) (Oral)   Ht 5\' 10"  (1.778 m)   Wt 249 lb 8 oz (113.2 kg)   SpO2 96%   BMI 35.80 kg/m chart     Objective:    BP (!) 144/86 (BP Location: Left Arm, Patient Position: Sitting, Cuff Size: Large)   Pulse 85   Temp 99 F (37.2 C) (Oral)   Ht 5\' 10"  (1.778 m)   Wt 249 lb 8 oz (113.2 kg)   SpO2 96%   BMI 35.80 kg/m    Physical Exam Vitals reviewed.  Constitutional:      Appearance: Normal appearance.  HENT:     Right Ear: Tympanic membrane, ear canal and external ear normal.     Left Ear: Tympanic membrane, ear canal and external ear normal.     Nose: Congestion present. No rhinorrhea.     Mouth/Throat:     Mouth: Mucous membranes are moist.     Pharynx: Oropharynx is clear.  Cardiovascular:     Rate and Rhythm: Normal rate and regular rhythm.  Pulmonary:     Effort: Pulmonary effort is normal.     Breath sounds: Normal breath sounds.  Musculoskeletal:        General: Normal range of motion.     Cervical back: Normal range of motion and neck supple.  Skin:    General: Skin is warm and dry.  Neurological:     General: No focal deficit present.     Mental Status: He is alert and oriented to person, place, and time.   Results for orders placed or performed in visit on 08/17/22  POC Influenza A/B  Result Value Ref Range   Influenza A, POC Negative Negative   Influenza B, POC Negative Negative  POC COVID-19   Result Value Ref Range   SARS Coronavirus 2 Ag Negative Negative  Assessment & Plan:   Problem List Items Addressed This Visit   None Visit Diagnoses     Acute URI    -  Primary   Relevant Orders   POC Influenza A/B (Completed)   POC COVID-19 (Completed)   Acute cough       Relevant Orders   POC Influenza A/B (Completed)   POC COVID-19 (Completed)       Meds ordered this encounter  Medications  . promethazine-dextromethorphan (PROMETHAZINE-DM) 6.25-15 MG/5ML syrup    Sig: Take 5 mLs by mouth 4 (four) times daily as needed.    Dispense:  118 mL    Refill:  0   Rest, drink plenty of fluids, call the office if symptoms worsen or persist. Recheck as scheduled and sooner as needed./ No follow-ups on file.  Kennyth Arnold, FNP

## 2022-08-20 ENCOUNTER — Other Ambulatory Visit: Payer: Self-pay

## 2022-08-20 ENCOUNTER — Telehealth: Payer: Self-pay | Admitting: Family Medicine

## 2022-08-20 MED ORDER — AZITHROMYCIN 250 MG PO TABS: ORAL_TABLET | ORAL | 0 refills | Status: AC

## 2022-08-20 NOTE — Telephone Encounter (Signed)
Pt seen padonda on 08-17-2022 for URI and per pt he was told provider will call abx in if he is no better. Pt is no better  [id number] [place], [place] - [address] Phone: [phone]  Fax: [phone]

## 2022-08-20 NOTE — Telephone Encounter (Signed)
Pt would like a callback once rx has been sent to pharm 

## 2022-08-20 NOTE — Telephone Encounter (Signed)
Call in a Zpack  ?

## 2022-08-20 NOTE — Telephone Encounter (Signed)
Spoke with pt aware that Rx for Z-Pack was sent to his pharmacy per Dr Clent Ridges, verbalized understanding

## 2022-08-27 ENCOUNTER — Other Ambulatory Visit: Payer: Self-pay | Admitting: Rheumatology

## 2022-08-27 ENCOUNTER — Other Ambulatory Visit (HOSPITAL_COMMUNITY): Payer: Self-pay

## 2022-08-27 DIAGNOSIS — L400 Psoriasis vulgaris: Secondary | ICD-10-CM

## 2022-09-03 ENCOUNTER — Telehealth: Payer: Self-pay | Admitting: Family Medicine

## 2022-09-03 NOTE — Telephone Encounter (Signed)
Pt would like to transfer to [facility] from [facility]. Please advise

## 2022-09-03 NOTE — Telephone Encounter (Signed)
Unable to accept transfer at this time.  Donald Pearson. [name], MD 09/03/2022 9:35 AM

## 2022-09-09 ENCOUNTER — Other Ambulatory Visit (HOSPITAL_COMMUNITY): Payer: Self-pay

## 2022-09-17 ENCOUNTER — Other Ambulatory Visit: Payer: Self-pay

## 2022-09-21 NOTE — Progress Notes (Deleted)
Office Visit Note  Patient: Donald Pearson             Date of Birth: 02/13/1976           MRN: [id number]             PCP: Kristian Covey, MD Referring: Kristian Covey, MD Visit Date: 10/05/2022 Occupation: @GUAROCC @  Subjective:  No chief complaint on file.   History of Present Illness: Donald Pearson is a 47 y.o. male ***     Activities of Daily Living:  Patient reports morning stiffness for *** {minute/hour:19697}.   Patient {ACTIONS;DENIES/REPORTS:21021675::"Denies"} nocturnal pain.  Difficulty dressing/grooming: {ACTIONS;DENIES/REPORTS:21021675::"Denies"} Difficulty climbing stairs: {ACTIONS;DENIES/REPORTS:21021675::"Denies"} Difficulty getting out of chair: {ACTIONS;DENIES/REPORTS:21021675::"Denies"} Difficulty using hands for taps, buttons, cutlery, and/or writing: {ACTIONS;DENIES/REPORTS:21021675::"Denies"}  No Rheumatology ROS completed.   PMFS History:  Patient Active Problem List   Diagnosis Date Noted   Psoriatic arthropathy (HCC) 06/28/2018   Family history of psoriasis 06/28/2018   Smoker 06/28/2018   Sacroiliitis (HCC) 06/28/2018   Plaque psoriasis 12/02/2016   Social anxiety disorder 07/15/2011   History of depression 01/25/2011   Asthma, mild intermittent 01/25/2011    Past Medical History:  Diagnosis Date   Asthma    Depression    Psoriatic arthropathy (HCC)     Family History  Problem Relation Age of Onset   Diabetes Mother        type ll   Heart disease Maternal Grandmother    Diabetes Maternal Grandmother        type ll   Healthy Brother    Healthy Son    Healthy Daughter    Past Surgical History:  Procedure Laterality Date   MOLE REMOVAL  2004   VASECTOMY     WISDOM TOOTH EXTRACTION     Social History   Social History Narrative   Not on file   Immunization History  Administered Date(s) Administered   Influenza,inj,Quad PF,6+ Mos 03/24/2019, 01/29/2020   Influenza-Unspecified 03/17/2016, 03/08/2017    PFIZER(Purple Top)SARS-COV-2 Vaccination 08/02/2019, 08/27/2019   Pneumococcal Polysaccharide-23 05/19/2021   Tdap 06/17/2014     Objective: Vital Signs: There were no vitals taken for this visit.   Physical Exam   Musculoskeletal Exam: ***  CDAI Exam: CDAI Score: -- Patient Global: --; Provider Global: -- Swollen: --; Tender: -- Joint Exam 10/05/2022   No joint exam has been documented for this visit   There is currently no information documented on the homunculus. Go to the Rheumatology activity and complete the homunculus joint exam.  Investigation: No additional findings.  Imaging: No results found.  Recent Labs: Lab Results  Component Value Date   WBC 7.9 05/19/2021   HGB 15.4 05/19/2021   PLT 211.0 05/19/2021   NA 138 05/19/2021   K 4.8 05/19/2021   CL 102 05/19/2021   CO2 29 05/19/2021   GLUCOSE 97 05/19/2021   BUN 13 05/19/2021   CREATININE 1.05 05/19/2021   BILITOT 0.7 05/19/2021   ALKPHOS 69 05/19/2021   AST 24 05/19/2021   ALT 33 05/19/2021   PROT 7.3 05/19/2021   ALBUMIN 4.4 05/19/2021   CALCIUM 9.6 05/19/2021   GFRAA 109 06/16/2018   QFTBGOLDPLUS NEGATIVE 06/16/2018    Speciality Comments: No specialty comments available.  Procedures:  No procedures performed Allergies: Penicillins   Assessment / Plan:     Visit Diagnoses: Psoriatic arthropathy (HCC)  Plaque psoriasis  High risk medication use  Sacroiliitis (HCC)  Injury of right knee, sequela  Family history of psoriasis  History of asthma  Social anxiety disorder  History of depression  Smoker  Lateral epicondylitis, left elbow  Orders: No orders of the defined types were placed in this encounter.  No orders of the defined types were placed in this encounter.   Face-to-face time spent with patient was *** minutes. Greater than 50% of time was spent in counseling and coordination of care.  Follow-Up Instructions: No follow-ups on file.   Gearldine Bienenstock,  PA-C  Note - This record has been created using Dragon software.  Chart creation errors have been sought, but may not always  have been located. Such creation errors do not reflect on  the standard of medical care.

## 2022-09-23 ENCOUNTER — Other Ambulatory Visit (HOSPITAL_COMMUNITY): Payer: Self-pay

## 2022-10-04 NOTE — Progress Notes (Unsigned)
Office Visit Note  Patient: Donald Pearson             Date of Birth: [birth year]           MRN: [id number]             PCP: Kristian Covey, MD Referring: Kristian Covey, MD Visit Date: 10/14/2022 Occupation: @GUAROCC @  Subjective:  Psoriasis   History of Present Illness: Donald Pearson is a 47 y.o. male with history of psoriatic arthritis.  Patient states that he has been out of [place] for the past 6 to 8 weeks.  He states that he is currently having a flare of psoriasis on both elbows, both knees, and his lower legs.  He states that even while taking [place] consistently his psoriasis has never resolved.  In the past he has been hesitant of any other immunosuppressive agents but he denies any history of recurrent infections.  Patient states he continues to have SI joint pain and has intermittent inflammation in the right knee.  He denies any Achilles tendinitis or plantar fasciitis.    Activities of Daily Living:  Patient reports morning stiffness for a few minutes.   Patient Denies nocturnal pain.  Difficulty dressing/grooming: Denies Difficulty climbing stairs: Denies Difficulty getting out of chair: Denies Difficulty using hands for taps, buttons, cutlery, and/or writing: Denies  Review of Systems  Constitutional:  Negative for fatigue.  HENT:  Negative for mouth sores and mouth dryness.   Eyes:  Negative for dryness.  Respiratory:  Negative for shortness of breath.   Cardiovascular:  Negative for chest pain and palpitations.  Gastrointestinal:  Positive for heartburn. Negative for blood in stool, constipation, diarrhea, nausea and vomiting.  Endocrine: Negative for increased urination.  Genitourinary:  Negative for involuntary urination.  Musculoskeletal:  Positive for morning stiffness. Negative for joint pain, gait problem, joint pain, joint swelling, myalgias, muscle weakness, muscle tenderness and myalgias.  Skin:  Positive for rash. Negative for color change,  hair loss and sensitivity to sunlight.  Allergic/Immunologic: Negative for susceptible to infections.  Neurological:  Negative for dizziness and headaches.  Hematological:  Negative for swollen glands.  Psychiatric/Behavioral:  Positive for depressed mood. Negative for sleep disturbance. The patient is nervous/anxious.     PMFS History:  Patient Active Problem List   Diagnosis Date Noted   Psoriatic arthropathy (HCC) 06/28/2018   Family history of psoriasis 06/28/2018   Smoker 06/28/2018   Sacroiliitis (HCC) 06/28/2018   Plaque psoriasis 12/02/2016   Social anxiety disorder 07/15/2011   History of depression 01/25/2011   Asthma, mild intermittent 01/25/2011    Past Medical History:  Diagnosis Date   Asthma    Depression    Psoriatic arthropathy (HCC)     Family History  Problem Relation Age of Onset   Diabetes Mother        type ll   Heart disease Maternal Grandmother    Diabetes Maternal Grandmother        type ll   Healthy Brother    Healthy Son    Healthy Daughter    Past Surgical History:  Procedure Laterality Date   MOLE REMOVAL  2004   VASECTOMY     WISDOM TOOTH EXTRACTION     Social History   Social History Narrative   Not on file   Immunization History  Administered Date(s) Administered   Influenza,inj,Quad PF,6+ Mos 03/24/2019, 01/29/2020   Influenza-Unspecified 03/17/2016, 03/08/2017   PFIZER(Purple Top)SARS-COV-2 Vaccination 08/02/2019, 08/27/2019   Pneumococcal Polysaccharide-23 05/19/2021  Tdap 06/17/2014     Objective: Vital Signs: BP (!) 161/88 (BP Location: Left Arm, Patient Position: Sitting, Cuff Size: Large)   Pulse 94   Resp 18   Ht 5\' 10"  (1.778 m)   Wt 254 lb 3.2 oz (115.3 kg)   BMI 36.47 kg/m    Physical Exam Vitals and nursing note reviewed.  Constitutional:      Appearance: He is well-developed.  HENT:     Head: Normocephalic and atraumatic.  Eyes:     Conjunctiva/sclera: Conjunctivae normal.     Pupils: Pupils are  equal, round, and reactive to light.  Cardiovascular:     Rate and Rhythm: Normal rate and regular rhythm.     Heart sounds: Normal heart sounds.  Pulmonary:     Effort: Pulmonary effort is normal.     Breath sounds: Normal breath sounds.  Abdominal:     General: Bowel sounds are normal.     Palpations: Abdomen is soft.  Musculoskeletal:     Cervical back: Normal range of motion and neck supple.  Skin:    General: Skin is warm and dry.     Capillary Refill: Capillary refill takes less than 2 seconds.     Comments: Patches of psoriasis noted on the extensor surface of both elbows and the anterior surface of both knees and lower legs.   Neurological:     Mental Status: He is alert and oriented to person, place, and time.  Psychiatric:        Behavior: Behavior normal.      Musculoskeletal Exam: C-spine, thoracic spine, lumbar spine good range of motion.  No midline spinal tenderness.  Tenderness over both SI joints.  Shoulder joints, elbow joints, wrist joints, MCPs, PIPs, DIPs have good range of motion with no synovitis.  Complete fist formation bilaterally.  Hip joints have good range of motion with no groin pain.  Knee joints have good range of motion with some fullness in the right knee.  Ankle joints have good range of motion with no tenderness or synovitis.  No evidence of Achilles tendinitis or plantar fasciitis.  CDAI Exam: CDAI Score: -- Patient Global: --; Provider Global: -- Swollen: --; Tender: -- Joint Exam 10/14/2022   No joint exam has been documented for this visit   There is currently no information documented on the homunculus. Go to the Rheumatology activity and complete the homunculus joint exam.  Investigation: No additional findings.  Imaging: No results found.  Recent Labs: Lab Results  Component Value Date   WBC 7.9 05/19/2021   HGB 15.4 05/19/2021   PLT 211.0 05/19/2021   NA 138 05/19/2021   K 4.8 05/19/2021   CL 102 05/19/2021   CO2 29  05/19/2021   GLUCOSE 97 05/19/2021   BUN 13 05/19/2021   CREATININE 1.05 05/19/2021   BILITOT 0.7 05/19/2021   ALKPHOS 69 05/19/2021   AST 24 05/19/2021   ALT 33 05/19/2021   PROT 7.3 05/19/2021   ALBUMIN 4.4 05/19/2021   CALCIUM 9.6 05/19/2021   GFRAA 109 06/16/2018   QFTBGOLDPLUS NEGATIVE 06/16/2018    Speciality Comments: No specialty comments available.  Procedures:  No procedures performed Allergies: Penicillins   Assessment / Plan:     Visit Diagnoses: Psoriatic arthropathy (HCC): He has no synovitis or dactylitis on examination today.  He continues to have ongoing SI joint pain bilaterally.  He presents today experiencing a flare of psoriasis on the extensor surface of both elbows and bilateral lower legs.  He has been out of his prescription for [facility] for the past 6 to 8 weeks.  Even when he is able to take [place] consistently his psoriasis has not cleared since initiating therapy in 2020.  He has been tolerating [place] without any side effects but often times has difficulty taking Otezla twice a day.  Different treatment options were discussed today in detail.  Indications, contraindications, and potential side effects of Tremfya were discussed today in detail.  Consent was obtained.  Plan to apply for Tremfya through his insurance and once approved to return to the office for ministration of the first injection.  In the meantime a refill of [place] was sent to the pharmacy along with a refill of clobetasol.  He will follow-up in the office in 8 weeks to assess his response.  Plaque psoriasis -He has patches of plaque psoriasis on the extensor surface of both elbows and anterior surface of both knees.  Scattered patches noted on bilateral lower legs.  He has been using clobetasol cream topically as needed.  He has been out of [place] for the past 6 to 8 weeks but even when he takes [place] consistently his psoriasis has not cleared.  Plan to initiate Tremfya pending insurance approval.   In the meantime a refill of Otezla and clobetasol were sent to the pharmacy.  Plan: Apremilast (OTEZLA) 30 MG TABS  Counseled patient that Tremfya is a IL-23 inhibitor.  Counseled patient on purpose, proper use, and adverse effects of Tremfya.  Reviewed the most common adverse effects including infection, URTIs, injection site reactions, nausea/diarrhea, and recurrence of tinea and HSV infections Counseled patient that Tremfya should be held for infection and prior to scheduled surgery.  Recommend annual influenza, PCV 15 or PCV20 or Pneumovax 23, and Shingrix as indicated.  Reviewed the importance of regular labs while on Tremfya therapy.  Will monitor CBC and CMP 1 month after starting and then every 3 months routinely thereafter. Will monitor TB gold annually. Standing orders placed.  Provided patient with medication education material and answered all questions.  Patient voiced understanding.  Patient consented to [facility].  Will upload consent into the media tab.  Reviewed storage instructions of Tremfya.  Advised initial injection must be administered in office.  Patient verbalized understanding.  Will apply for Tremfya through patient's insurance and update when we receive a response.  Dose will be Tremfya 100 mg on weeks 0 and 4 then every 8 weeks thereafter.  Prescription pending lab results and insurance approval.  High risk medication use - Plan to apply for Tremfya through his insurance and once approved she will return to the office for administration of the first injection. Otezla 30 mg 1 tablet by mouth twice daily (started in February 2020). CBC and CMP updated today.  His next lab work will be due in 1 month and every 3 months to monitor for drug toxicity.  Standing orders for CBC and CMP were placed today.  TB Gold updated today. Lab work from 06/16/2018 was reviewed today in the office: IFE normal, TB Gold negative, HIV negative, hepatitis panel negative  - Plan: CBC with  Differential/Platelet, COMPLETE METABOLIC PANEL WITH GFR, CBC with Differential/Platelet, COMPLETE METABOLIC PANEL WITH GFR, QuantiFERON-TB Gold Plus  Screening for tuberculosis: Order for TB gold released today.   Sacroiliitis (HCC) - X-rays were unremarkable on 07/28/20.  He has ongoing discomfort in both SI joints.  Tenderness upon palpation noted bilaterally.  Plan to initiate Tremfya pending insurance approval.  Injury  of right knee, sequela - Fall-meniscal injury and a tendon tear.  He has been followed by orthopedic surgeon at [facility].  He continues to have some inflammation in the right knee joint noted on examination today.  Other medical conditions are listed as follows:  Family history of psoriasis  Social anxiety disorder  History of asthma  History of depression  Smoker    Orders: Orders Placed This Encounter  Procedures   CBC with Differential/Platelet   COMPLETE METABOLIC PANEL WITH GFR   CBC with Differential/Platelet   COMPLETE METABOLIC PANEL WITH GFR   QuantiFERON-TB Gold Plus   Meds ordered this encounter  Medications   Apremilast (OTEZLA) 30 MG TABS    Sig: TAKE 1 TABLET BY MOUTH 2 TIMES DAILY.    Dispense:  180 tablet    Refill:  0   clobetasol cream (TEMOVATE) 0.05 %    Sig: Apply 1 Application topically 2 (two) times daily.    Dispense:  30 g    Refill:  1     Follow-Up Instructions: Return in about 8 weeks (around 12/09/2022) for Psoriatic arthritis.   Gearldine Bienenstock, PA-C  Note - This record has been created using Dragon software.  Chart creation errors have been sought, but may not always  have been located. Such creation errors do not reflect on  the standard of medical care.

## 2022-10-05 ENCOUNTER — Ambulatory Visit: Payer: Managed Care, Other (non HMO) | Admitting: Physician Assistant

## 2022-10-05 DIAGNOSIS — L4050 Arthropathic psoriasis, unspecified: Secondary | ICD-10-CM

## 2022-10-05 DIAGNOSIS — F17200 Nicotine dependence, unspecified, uncomplicated: Secondary | ICD-10-CM

## 2022-10-05 DIAGNOSIS — F4010 Social phobia, unspecified: Secondary | ICD-10-CM

## 2022-10-05 DIAGNOSIS — M461 Sacroiliitis, not elsewhere classified: Secondary | ICD-10-CM

## 2022-10-05 DIAGNOSIS — Z8659 Personal history of other mental and behavioral disorders: Secondary | ICD-10-CM

## 2022-10-05 DIAGNOSIS — M7712 Lateral epicondylitis, left elbow: Secondary | ICD-10-CM

## 2022-10-05 DIAGNOSIS — Z8709 Personal history of other diseases of the respiratory system: Secondary | ICD-10-CM

## 2022-10-05 DIAGNOSIS — S8991XS Unspecified injury of right lower leg, sequela: Secondary | ICD-10-CM

## 2022-10-05 DIAGNOSIS — L400 Psoriasis vulgaris: Secondary | ICD-10-CM

## 2022-10-05 DIAGNOSIS — Z79899 Other long term (current) drug therapy: Secondary | ICD-10-CM

## 2022-10-05 DIAGNOSIS — Z840 Family history of diseases of the skin and subcutaneous tissue: Secondary | ICD-10-CM

## 2022-10-07 ENCOUNTER — Other Ambulatory Visit (HOSPITAL_COMMUNITY): Payer: Self-pay

## 2022-10-14 ENCOUNTER — Other Ambulatory Visit: Payer: Self-pay

## 2022-10-14 ENCOUNTER — Telehealth: Payer: Self-pay | Admitting: Pharmacist

## 2022-10-14 ENCOUNTER — Other Ambulatory Visit (HOSPITAL_COMMUNITY): Payer: Self-pay

## 2022-10-14 ENCOUNTER — Ambulatory Visit: Payer: Managed Care, Other (non HMO) | Attending: Physician Assistant | Admitting: Physician Assistant

## 2022-10-14 ENCOUNTER — Encounter: Payer: Self-pay | Admitting: Physician Assistant

## 2022-10-14 VITALS — BP 161/88 | HR 94 | Resp 18 | Ht 70.0 in | Wt 254.2 lb

## 2022-10-14 DIAGNOSIS — L400 Psoriasis vulgaris: Secondary | ICD-10-CM

## 2022-10-14 DIAGNOSIS — S8991XS Unspecified injury of right lower leg, sequela: Secondary | ICD-10-CM

## 2022-10-14 DIAGNOSIS — F17200 Nicotine dependence, unspecified, uncomplicated: Secondary | ICD-10-CM

## 2022-10-14 DIAGNOSIS — M461 Sacroiliitis, not elsewhere classified: Secondary | ICD-10-CM

## 2022-10-14 DIAGNOSIS — L4050 Arthropathic psoriasis, unspecified: Secondary | ICD-10-CM

## 2022-10-14 DIAGNOSIS — Z8659 Personal history of other mental and behavioral disorders: Secondary | ICD-10-CM

## 2022-10-14 DIAGNOSIS — Z111 Encounter for screening for respiratory tuberculosis: Secondary | ICD-10-CM

## 2022-10-14 DIAGNOSIS — F4010 Social phobia, unspecified: Secondary | ICD-10-CM

## 2022-10-14 DIAGNOSIS — Z79899 Other long term (current) drug therapy: Secondary | ICD-10-CM

## 2022-10-14 DIAGNOSIS — Z840 Family history of diseases of the skin and subcutaneous tissue: Secondary | ICD-10-CM

## 2022-10-14 DIAGNOSIS — Z8709 Personal history of other diseases of the respiratory system: Secondary | ICD-10-CM

## 2022-10-14 MED ORDER — OTEZLA 30 MG PO TABS
1.0000 | ORAL_TABLET | Freq: Two times a day (BID) | ORAL | 0 refills | Status: DC
  Filled 2022-10-14: qty 60, 30d supply, fill #0
  Filled 2022-11-03: qty 60, 30d supply, fill #1

## 2022-10-14 MED ORDER — CLOBETASOL PROPIONATE 0.05 % EX CREA: 1.0000 | TOPICAL_CREAM | Freq: Two times a day (BID) | CUTANEOUS | 1 refills | Status: AC

## 2022-10-14 NOTE — Patient Instructions (Signed)
Standing Labs We placed an order today for your standing lab work.   Please have your standing labs drawn in 1 month then every 3 months   Please have your labs drawn 2 weeks prior to your appointment so that the provider can discuss your lab results at your appointment, if possible.  Please note that you may see your imaging and lab results in MyChart before we have reviewed them. We will contact you once all results are reviewed. Please allow our office up to 72 hours to thoroughly review all of the results before contacting the office for clarification of your results.  WALK-IN LAB HOURS  Monday through Thursday from 8:00 am -12:30 pm and 1:00 pm-5:00 pm and Friday from 8:00 am-12:00 pm.  Patients with office visits requiring labs will be seen before walk-in labs.  You may encounter longer than normal wait times. Please allow additional time. Wait times may be shorter on  Monday and Thursday afternoons.  We do not book appointments for walk-in labs. We appreciate your patience and understanding with our staff.   Labs are drawn by [facility]. Please bring your co-pay at the time of your lab draw.  You may receive a bill from [facility] for your lab work.  Please note if you are on Hydroxychloroquine and and an order has been placed for a Hydroxychloroquine level,  you will need to have it drawn 4 hours or more after your last dose.  If you wish to have your labs drawn at another location, please call the office 24 hours in advance so we can fax the orders.  The office is located at [address], [address], [place], [place] [address]   If you have any questions regarding directions or hours of operation,  please call [phone].   As a reminder, please drink plenty of water prior to coming for your lab work. Thanks!   Guselkumab Injection What is this medication? GUSELKUMAB (goo ZELK ue mab) treats autoimmune conditions, such as arthritis and psoriasis. It works by slowing down an  overactive immune system. It is a monoclonal antibody. This medicine may be used for other purposes; ask your health care provider or pharmacist if you have questions. COMMON BRAND NAME(S): Tremfya What should I tell my care team before I take this medication? They need to know if you have any of these conditions: Immune system problems Infection, such as a virus infection, chickenpox, cold sores, herpes, or a history of infections Recently received or scheduled to receive a vaccine Tuberculosis, a positive skin test for tuberculosis, or recent close contact with someone who has tuberculosis An unusual or allergic reaction to guselkumab, other medications, foods, dyes, or preservatives Pregnant or trying to get pregnant Breast-feeding How should I use this medication? This medication is injected under the skin. It is usually given by a care team in a hospital or clinic setting. It may also be given at home. If you get this medication at home, you will be taught how to prepare and give it. Use exactly as directed. Take it as directed on the prescription label. Keep taking it unless your care team tells you to stop. It is important that you put your used needles and syringes in a special sharps container. Do not put them in a trash can. If you do not have a sharps container, call your pharmacist or care team to get one. This medication comes with INSTRUCTIONS FOR USE. Ask your pharmacist for directions on how to use this  medication. Read the information carefully. Talk to your pharmacist or care team if you have questions. A special MedGuide will be given to you by the pharmacist with each prescription and refill. Be sure to read this information carefully each time. Talk to your care team about the use of this medication in children. Special care may be needed. Overdosage: If you think you have taken too much of this medicine contact a poison control center or emergency room at once. NOTE: This  medicine is only for you. Do not share this medicine with others. What if I miss a dose? If you get this medication at a hospital or clinic: It is important not to miss your dose. Call your care team if you are unable to keep an appointment. If you give yourself this medication at home: If you miss a dose, take it as soon as you can. Then continue your normal schedule. If it is almost time for your next dose, take only that dose. Do not take double or extra doses. Call your care team with questions. What may interact with this medication? Do not take this medication with any of the following: Live virus vaccines This medication may also interact with the following: Amoxapine Certain medications for depression, anxiety, or mental health conditions, such as amitriptyline, clomipramine, desipramine, doxepin, imipramine, maprotiline, nortriptyline, protriptyline, trimipramine Codeine Inactivated vaccines Methadone Pimozide Thioridazine This list may not describe all possible interactions. Give your health care provider a list of all the medicines, herbs, non-prescription drugs, or dietary supplements you use. Also tell them if you smoke, drink alcohol, or use illegal drugs. Some items may interact with your medicine. What should I watch for while using this medication? Your condition will be monitored carefully while you are receiving this medication. Tell your care team if your symptoms do not start to get better or if they get worse. You will be tested for tuberculosis (TB) before you start this medication. If your care team prescribes any medication for TB, you should start taking the TB medication before starting this medication. Make sure to finish the full course of TB medication. This medication may increase your risk of getting an infection. Call your care team for advice if you get a fever, chills, sore throat, or other symptoms of a cold or flu. Do not treat yourself. Try to avoid being  around people who are sick. What side effects may I notice from receiving this medication? Side effects that you should report to your care team as soon as possible: Allergic reactions--skin rash, itching, hives, swelling of the face, lips, tongue, or throat Infection--fever, chills, cough, sore throat, wounds that don't heal, pain or trouble when passing urine, general feeling of discomfort or being unwell Side effects that usually do not require medical attention (report to your care team if they continue or are bothersome): Diarrhea Headache Joint pain Pain, redness, or irritation at injection site Runny or stuffy nose Sore throat This list may not describe all possible side effects. Call your doctor for medical advice about side effects. You may report side effects to FDA at 1-800-FDA-1088. Where should I keep my medication? Keep out of the reach of children and pets. Store in the refrigerator. Do not freeze. Keep it in the original container until you are ready to use. Protect from light. Do not shake. Remove the dose from the refrigerator about 30 minutes prior to use. Get rid of any unused medication after the expiration date on the label. To get  rid of medications that are no longer needed or have expired: Take the medication to a medication take-back program. Check with your pharmacy or law enforcement to find a location. If you cannot return the medication, ask your pharmacist or care team how to get rid of this medication safely. NOTE: This sheet is a summary. It may not cover all possible information. If you have questions about this medicine, talk to your doctor, pharmacist, or health care provider.  2024 Elsevier/Gold Standard (2021-11-19 00:00:00)

## 2022-10-14 NOTE — Telephone Encounter (Addendum)
Pending OV note from today, please start Tremfya BIV  Dose: 100mg  at Week 0, Week 4, then every 8 weeks thereafter  ----- Message from Ellen Henri, CMA sent at 10/14/2022  3:26 PM EDT ----- Please apply for tremfya, per Sherron Ales, PA-C. Thanks!   Consent obtained and sent to the scan center.

## 2022-10-15 ENCOUNTER — Other Ambulatory Visit (HOSPITAL_COMMUNITY): Payer: Self-pay

## 2022-10-15 ENCOUNTER — Other Ambulatory Visit: Payer: Self-pay

## 2022-10-15 LAB — QUANTIFERON-TB GOLD PLUS
Mitogen-NIL: 7.79 IU/mL
NIL: 0.02 IU/mL
QuantiFERON-TB Gold Plus: NEGATIVE
TB1-NIL: 0 IU/mL
TB2-NIL: 0.01 IU/mL

## 2022-10-15 LAB — CBC WITH DIFFERENTIAL/PLATELET
Absolute Monocytes: 757 cells/uL (ref 200–950)
Basophils Absolute: 116 cells/uL (ref 0–200)
Basophils Relative: 1.2 %
Eosinophils Absolute: 252 cells/uL (ref 15–500)
Eosinophils Relative: 2.6 %
HCT: 49.2 % (ref 38.5–50.0)
Hemoglobin: 16.8 g/dL (ref 13.2–17.1)
Lymphs Abs: 2008 cells/uL (ref 850–3900)
MCH: 31.2 pg (ref 27.0–33.0)
MCHC: 34.1 g/dL (ref 32.0–36.0)
MCV: 91.3 fL (ref 80.0–100.0)
MPV: 10.2 fL (ref 7.5–12.5)
Monocytes Relative: 7.8 %
Neutro Abs: 6567 cells/uL (ref 1500–7800)
Neutrophils Relative %: 67.7 %
Platelets: 217 10*3/uL (ref 140–400)
RBC: 5.39 10*6/uL (ref 4.20–5.80)
RDW: 12.2 % (ref 11.0–15.0)
Total Lymphocyte: 20.7 %
WBC: 9.7 10*3/uL (ref 3.8–10.8)

## 2022-10-15 LAB — COMPLETE METABOLIC PANEL WITH GFR
AG Ratio: 2 (calc) (ref 1.0–2.5)
ALT: 30 U/L (ref 9–46)
AST: 21 U/L (ref 10–40)
Albumin: 5.2 g/dL — ABNORMAL HIGH (ref 3.6–5.1)
Alkaline phosphatase (APISO): 87 U/L (ref 36–130)
BUN: 13 mg/dL (ref 7–25)
CO2: 30 mmol/L (ref 20–32)
Calcium: 10.1 mg/dL (ref 8.6–10.3)
Chloride: 102 mmol/L (ref 98–110)
Creat: 0.98 mg/dL (ref 0.60–1.29)
Globulin: 2.6 g/dL (calc) (ref 1.9–3.7)
Glucose, Bld: 94 mg/dL (ref 65–99)
Potassium: 4.8 mmol/L (ref 3.5–5.3)
Sodium: 142 mmol/L (ref 135–146)
Total Bilirubin: 0.6 mg/dL (ref 0.2–1.2)
Total Protein: 7.8 g/dL (ref 6.1–8.1)
eGFR: 98 mL/min/{1.73_m2} (ref >=60)

## 2022-10-15 MED ORDER — TREMFYA 100 MG/ML ~~LOC~~ SOAJ
SUBCUTANEOUS | 0 refills | Status: DC
Start: 2022-10-15 — End: 2022-10-27
  Filled 2022-10-15: qty 1, fill #0
  Filled 2022-10-18: qty 1, 28d supply, fill #0

## 2022-10-15 NOTE — Progress Notes (Signed)
Albumin borderline elevated. Rest of CMP WNL. CBC WNL.  We will continue to monitor.

## 2022-10-15 NOTE — Telephone Encounter (Signed)
Received notification from [employer] regarding a prior authorization for [facility]. Authorization has been APPROVED from 10/15/22 to 10/15/23.   Per test claim, copay for 28 days supply is $862.86  Patient can fill through [facility]: [phone]   Authorization # [id number]  Enrolled patient into Tremfya copay card: BIN: [id number] Group: [id number] ID: [id number]  Rx sent to [facility] for first dose to be couriered to clinic by appt (tentatively earliest it would be is 10/21/22)  Chesley Mires, PharmD, MPH, BCPS, CPP Clinical Pharmacist (Rheumatology and Pulmonology)60

## 2022-10-15 NOTE — Telephone Encounter (Signed)
Submitted a Prior Authorization request to [employer] for [facility] via CoverMyMeds. Will update once we receive a response.  Key: [id number]  Patient may be denied as it appears Humira, Enbrel are preferred options.   Chesley Mires, PharmD, MPH, BCPS, CPP Clinical Pharmacist (Rheumatology and Pulmonology)

## 2022-10-18 ENCOUNTER — Other Ambulatory Visit: Payer: Self-pay

## 2022-10-18 ENCOUNTER — Other Ambulatory Visit (HOSPITAL_COMMUNITY): Payer: Self-pay

## 2022-10-18 NOTE — Progress Notes (Signed)
TB gold negative.  Ok to initiate tremfya once approved.

## 2022-10-18 NOTE — Telephone Encounter (Signed)
Delivery instructions have been updated in [place], medication will be couriered to [facility] on 10/19/22.  Rx has been processed in [facility] and  Copay Card info has been applied.

## 2022-10-19 NOTE — Telephone Encounter (Signed)
Pt scheduled for Tremfya new start on 10/27/22  Chesley Mires, PharmD, MPH, BCPS, CPP Clinical Pharmacist (Rheumatology and Pulmonology)

## 2022-10-19 NOTE — Telephone Encounter (Signed)
ATC patient to schedule Tremfya new start. Unable to reach. Left VM  Chesley Mires, PharmD, MPH, BCPS, CPP Clinical Pharmacist (Rheumatology and Pulmonology)

## 2022-10-26 NOTE — Progress Notes (Unsigned)
Pharmacy Note  Subjective:   Patient presents to clinic today to receive first dose of Tremfya for psoriatic arthritis. Patient currently taking [place] however psoriasis never resolved while on therapy, recently took an 8 week break. Patient states that he has about 2-3 weeks worth of Otezla doses left and that he will continue to take them until all doses are completed. Patient also using clobetasol topical cream as needed.   Patient running a fever or have signs/symptoms of infection? No  Patient currently on antibiotics for the treatment of infection? No  Patient have any upcoming invasive procedures/surgeries? No  Objective: CMP     Component Value Date/Time   NA 142 10/14/2022 1455   K 4.8 10/14/2022 1455   CL 102 10/14/2022 1455   CO2 30 10/14/2022 1455   GLUCOSE 94 10/14/2022 1455   BUN 13 10/14/2022 1455   CREATININE 0.98 10/14/2022 1455   CALCIUM 10.1 10/14/2022 1455   PROT 7.8 10/14/2022 1455   ALBUMIN 4.4 05/19/2021 0836   AST 21 10/14/2022 1455   ALT 30 10/14/2022 1455   ALKPHOS 69 05/19/2021 0836   BILITOT 0.6 10/14/2022 1455   GFRNONAA 94 06/16/2018 0951   GFRAA 109 06/16/2018 0951    CBC    Component Value Date/Time   WBC 9.7 10/14/2022 1455   RBC 5.39 10/14/2022 1455   HGB 16.8 10/14/2022 1455   HCT 49.2 10/14/2022 1455   PLT 217 10/14/2022 1455   MCV 91.3 10/14/2022 1455   MCH 31.2 10/14/2022 1455   MCHC 34.1 10/14/2022 1455   RDW 12.2 10/14/2022 1455   LYMPHSABS 2,008 10/14/2022 1455   MONOABS 0.6 05/19/2021 0836   EOSABS 252 10/14/2022 1455   BASOSABS 116 10/14/2022 1455    Baseline Immunosuppressant Therapy Labs TB GOLD    Latest Ref Rng & Units 10/14/2022    2:55 PM  Quantiferon TB Gold  Quantiferon TB Gold Plus NEGATIVE NEGATIVE    Hepatitis Panel    Latest Ref Rng & Units 06/16/2018    9:51 AM  Hepatitis  Hep B Surface Ag NON-REACTI NON-REACTIVE   Hep B IgM NON-REACTI NON-REACTIVE   Hep C Ab NON-REACTI NON-REACTIVE   Hep A IgM  NON-REACTI NON-REACTIVE    HIV Lab Results  Component Value Date   HIV NON-REACTIVE 06/16/2018   Immunoglobulins    Latest Ref Rng & Units 06/16/2018    9:51 AM  Immunoglobulin Electrophoresis  IgA  47 - 310 mg/dL 161   IgG 096 - 0,454 mg/dL 098   IgM 50 - 119 mg/dL 46    SPEP    Latest Ref Rng & Units 10/14/2022    2:55 PM  Serum Protein Electrophoresis  Total Protein 6.1 - 8.1 g/dL 7.8    Chest x-ray: 14/78/2956 No acute findings  Assessment/Plan:  Reviewed importance of holding Tremfya with signs/symptoms of an infections, if antibiotics are prescribed to treat an active infection, and with invasive procedures  Demonstrated proper injection technique with Tremfya demo device  Patient able to demonstrate proper injection technique using the teach back method.  Patient self injected in the left upper thigh with:  Sample Medication: Tremfya 100mg /mL autoinjector NDC: 21308-657-84 Lot: [id number] Expiration: 01/2024  Patient tolerated well. Observed for 30 mins in office for adverse reaction and no immediate reaction apparent.    Patient is to return in 1 month for labs and 6-8 weeks for follow-up appointment. Standing orders for CBC/CMP remain in place.  TB gold will be monitored yearly.  Tremfya approved through insurance .   Rx sent to: [facility]: [phone] .  Patient provided with pharmacy phone number and advised to call later this week to schedule shipment to home.  Patient will continue Tremfya subcut at week 4 then every 8 weeks in combination with the remaining of the Otezla doses.  All questions encouraged and answered.  Instructed patient to call with any further questions or concerns.  Chesley Mires, PharmD, MPH, BCPS, CPP Clinical Pharmacist (Rheumatology and Pulmonology)  10/26/2022 3:31 PM

## 2022-10-26 NOTE — Progress Notes (Incomplete)
Pharmacy Note  Subjective:   Patient presents to clinic today to receive first dose of Tremfya for psoriatic arthritis. Patient currently taking [place] however psoriasis never resolved while on therapy, recently took an 8 week break  Patient running a fever or have signs/symptoms of infection? {yes/no:20286}  Patient currently on antibiotics for the treatment of infection? {yes/no:20286}  Patient have any upcoming invasive procedures/surgeries? {yes/no:20286}  Objective: CMP     Component Value Date/Time   NA 142 10/14/2022 1455   K 4.8 10/14/2022 1455   CL 102 10/14/2022 1455   CO2 30 10/14/2022 1455   GLUCOSE 94 10/14/2022 1455   BUN 13 10/14/2022 1455   CREATININE 0.98 10/14/2022 1455   CALCIUM 10.1 10/14/2022 1455   PROT 7.8 10/14/2022 1455   ALBUMIN 4.4 05/19/2021 0836   AST 21 10/14/2022 1455   ALT 30 10/14/2022 1455   ALKPHOS 69 05/19/2021 0836   BILITOT 0.6 10/14/2022 1455   GFRNONAA 94 06/16/2018 0951   GFRAA 109 06/16/2018 0951    CBC    Component Value Date/Time   WBC 9.7 10/14/2022 1455   RBC 5.39 10/14/2022 1455   HGB 16.8 10/14/2022 1455   HCT 49.2 10/14/2022 1455   PLT 217 10/14/2022 1455   MCV 91.3 10/14/2022 1455   MCH 31.2 10/14/2022 1455   MCHC 34.1 10/14/2022 1455   RDW 12.2 10/14/2022 1455   LYMPHSABS 2,008 10/14/2022 1455   MONOABS 0.6 05/19/2021 0836   EOSABS 252 10/14/2022 1455   BASOSABS 116 10/14/2022 1455    Baseline Immunosuppressant Therapy Labs TB GOLD    Latest Ref Rng & Units 10/14/2022    2:55 PM  Quantiferon TB Gold  Quantiferon TB Gold Plus NEGATIVE NEGATIVE    Hepatitis Panel    Latest Ref Rng & Units 06/16/2018    9:51 AM  Hepatitis  Hep B Surface Ag NON-REACTI NON-REACTIVE   Hep B IgM NON-REACTI NON-REACTIVE   Hep C Ab NON-REACTI NON-REACTIVE   Hep A IgM NON-REACTI NON-REACTIVE    HIV Lab Results  Component Value Date   HIV NON-REACTIVE 06/16/2018   Immunoglobulins    Latest Ref Rng & Units 06/16/2018     9:51 AM  Immunoglobulin Electrophoresis  IgA  47 - 310 mg/dL 161   IgG 096 - 0,454 mg/dL 098   IgM 50 - 119 mg/dL 46    SPEP    Latest Ref Rng & Units 10/14/2022    2:55 PM  Serum Protein Electrophoresis  Total Protein 6.1 - 8.1 g/dL 7.8    J4NW No results found for: "G6PDH" TPMT No results found for: "TPMT"   Chest x-ray: ***  Assessment/Plan:  Reviewed importance of holding *** with signs/symptoms of an infections, if antibiotics are prescribed to treat an active infection, and with invasive procedures  Demonstrated proper injection technique with *** demo device  Patient able to demonstrate proper injection technique using the teach back method.  Patient self injected in the {injsitedsg:28167} with:  Sample Medication: *** NDC: *** Lot: *** Expiration: ***  Patient tolerated well.  Observed for 30 mins in office for adverse reaction and ***.   Patient is to return in 1 month for labs and 6-8 weeks for follow-up appointment.  Standing orders for CBC/CMP and *** placed.  TB gold will be monitored yearly. Lipid panel will be monitored every *** months. Referral to *** Dermatology placed today for yearly skin checks while on TNF inhibitor due to risk for non melanoma skin cancer  ***  approved through {specialtycoverage:25706} .   Rx sent to: {SpecialtyPharmacies:25705}.  Patient provided with pharmacy phone number and advised to call later this week to schedule shipment to home.  Patient will continue *** subcut every *** days in combination with ***.  All questions encouraged and answered.  Instructed patient to call with any further questions or concerns.  Chesley Mires, PharmD, MPH, BCPS, CPP Clinical Pharmacist (Rheumatology and Pulmonology)  10/26/2022 3:31 PM

## 2022-10-27 ENCOUNTER — Ambulatory Visit: Payer: Managed Care, Other (non HMO) | Attending: Rheumatology | Admitting: Pharmacist

## 2022-10-27 ENCOUNTER — Other Ambulatory Visit: Payer: Self-pay

## 2022-10-27 ENCOUNTER — Other Ambulatory Visit (HOSPITAL_COMMUNITY): Payer: Self-pay

## 2022-10-27 DIAGNOSIS — L4050 Arthropathic psoriasis, unspecified: Secondary | ICD-10-CM

## 2022-10-27 DIAGNOSIS — Z79899 Other long term (current) drug therapy: Secondary | ICD-10-CM

## 2022-10-27 DIAGNOSIS — Z7189 Other specified counseling: Secondary | ICD-10-CM

## 2022-10-27 DIAGNOSIS — L400 Psoriasis vulgaris: Secondary | ICD-10-CM

## 2022-10-27 MED ORDER — TREMFYA 100 MG/ML ~~LOC~~ SOAJ
SUBCUTANEOUS | 1 refills | Status: DC
Start: 2022-10-27 — End: 2023-01-26
  Filled 2022-10-27: qty 1, fill #0
  Filled 2022-11-03 – 2023-01-25 (×3): qty 1, 56d supply, fill #0

## 2022-11-03 ENCOUNTER — Other Ambulatory Visit (HOSPITAL_COMMUNITY): Payer: Self-pay

## 2022-11-09 ENCOUNTER — Other Ambulatory Visit (HOSPITAL_COMMUNITY): Payer: Self-pay

## 2022-11-10 ENCOUNTER — Other Ambulatory Visit: Payer: Self-pay

## 2022-11-10 ENCOUNTER — Other Ambulatory Visit (HOSPITAL_COMMUNITY): Payer: Self-pay

## 2022-11-10 NOTE — Telephone Encounter (Signed)
Please disregard

## 2022-11-19 ENCOUNTER — Telehealth: Payer: Self-pay

## 2022-11-19 ENCOUNTER — Other Ambulatory Visit (HOSPITAL_COMMUNITY): Payer: Self-pay

## 2022-11-19 DIAGNOSIS — Z79899 Other long term (current) drug therapy: Secondary | ICD-10-CM

## 2022-11-19 LAB — CBC WITH DIFFERENTIAL/PLATELET
Basophils Absolute: 77 cells/uL (ref 0–200)
Basophils Relative: 1.1 %
Eosinophils Absolute: 140 cells/uL (ref 15–500)
HCT: 46.4 % (ref 38.5–50.0)
Lymphs Abs: 1463 cells/uL (ref 850–3900)
MPV: 10.3 fL (ref 7.5–12.5)
RDW: 12.1 % (ref 11.0–15.0)

## 2022-11-19 NOTE — Telephone Encounter (Signed)
Patient presented to the office for a sample of tremfya. It appears that [facility] has rx. I called [facility] and was advised that per [facility]'s documentation in [facility]rigy, patient's insurance does not cover loading dose so patient is in need of 1 sample.     Medication Samples have been provided to the patient.  Drug name: Tremfya       Strength: 100mg /mL        Qty: 1  LOT: [id number]  Exp.Date: 11/14/2023  Dosing instructions: Inject 100mg  at week 4 and then every 8 weeks thereafter.

## 2022-11-20 LAB — CBC WITH DIFFERENTIAL/PLATELET
Absolute Monocytes: 679 cells/uL (ref 200–950)
Eosinophils Relative: 2 %
Hemoglobin: 15.9 g/dL (ref 13.2–17.1)
MCH: 31.3 pg (ref 27.0–33.0)
MCHC: 34.3 g/dL (ref 32.0–36.0)
MCV: 91.3 fL (ref 80.0–100.0)
Monocytes Relative: 9.7 %
Neutro Abs: 4641 cells/uL (ref 1500–7800)
Neutrophils Relative %: 66.3 %
Platelets: 192 10*3/uL (ref 140–400)
RBC: 5.08 10*6/uL (ref 4.20–5.80)
Total Lymphocyte: 20.9 %
WBC: 7 10*3/uL (ref 3.8–10.8)

## 2022-11-20 LAB — COMPLETE METABOLIC PANEL WITH GFR
AG Ratio: 2.1 (calc) (ref 1.0–2.5)
ALT: 29 U/L (ref 9–46)
AST: 18 U/L (ref 10–40)
Albumin: 4.7 g/dL (ref 3.6–5.1)
Alkaline phosphatase (APISO): 78 U/L (ref 36–130)
BUN: 14 mg/dL (ref 7–25)
CO2: 27 mmol/L (ref 20–32)
Calcium: 9.6 mg/dL (ref 8.6–10.3)
Chloride: 102 mmol/L (ref 98–110)
Creat: 0.98 mg/dL (ref 0.60–1.29)
Globulin: 2.2 g/dL (calc) (ref 1.9–3.7)
Glucose, Bld: 100 mg/dL — ABNORMAL HIGH (ref 65–99)
Potassium: 4.6 mmol/L (ref 3.5–5.3)
Sodium: 139 mmol/L (ref 135–146)
Total Bilirubin: 0.4 mg/dL (ref 0.2–1.2)
Total Protein: 6.9 g/dL (ref 6.1–8.1)
eGFR: 98 mL/min/{1.73_m2} (ref >=60)

## 2022-11-21 NOTE — Progress Notes (Signed)
CBC and CMP WNL

## 2022-11-25 NOTE — Progress Notes (Unsigned)
Office Visit Note  Patient: Donald Pearson             Date of Birth: [birth year]           MRN: [id number]             PCP: Kristian Covey, MD Referring: Kristian Covey, MD Visit Date: 12/09/2022 Occupation: @GUAROCC @  Subjective:  Medication monitoring   History of Present Illness: Donald Pearson is a 47 y.o. male with history of psoriatic arthritis.  Patient was initiated on Tremfya on 10/27/2022.  He has had 2 doses of Tremfya so far.  He is tolerating Tremfya without any side effects or injection site reactions.  He denies any recent or recurrent infections.  Patient has not yet noticed any improvement in his skin clearance since initiating Tremfya.  He continues to have patches of psoriasis on his elbows and a few patches on his lower legs especially overlying both knees.  He has a prescription for clobetasol cream but has not been using it recently.  Patient states that he recently was at the beach for 2 weeks which seem to help his psoriasis.  He denies any joint pain or joint swelling at this time.  He is not having any SI joint discomfort.  He denies any Achilles tendinitis or plantar fasciitis.  He denies any joint swelling.  He has not had any morning stiffness or nocturnal pain.   Activities of Daily Living:  Patient reports morning stiffness for 0 minutes.   Patient Denies nocturnal pain.  Difficulty dressing/grooming: Denies Difficulty climbing stairs: Denies Difficulty getting out of chair: Denies Difficulty using hands for taps, buttons, cutlery, and/or writing: Denies  Review of Systems  Constitutional:  Positive for fatigue.  HENT:  Negative for mouth sores and mouth dryness.   Eyes:  Negative for dryness.  Respiratory:  Negative for shortness of breath.   Cardiovascular:  Negative for chest pain and palpitations.  Gastrointestinal:  Negative for blood in stool, constipation and diarrhea.  Endocrine: Negative for increased urination.  Genitourinary:   Negative for involuntary urination.  Musculoskeletal:  Negative for joint pain, gait problem, joint pain, joint swelling, myalgias, muscle weakness, morning stiffness, muscle tenderness and myalgias.  Skin:  Positive for rash. Negative for color change, hair loss and sensitivity to sunlight.  Allergic/Immunologic: Negative for susceptible to infections.  Neurological:  Negative for dizziness and headaches.  Hematological:  Negative for swollen glands.  Psychiatric/Behavioral:  Negative for depressed mood and sleep disturbance. The patient is not nervous/anxious.     PMFS History:  Patient Active Problem List   Diagnosis Date Noted   Psoriatic arthropathy (HCC) 06/28/2018   Family history of psoriasis 06/28/2018   Smoker 06/28/2018   Sacroiliitis (HCC) 06/28/2018   Plaque psoriasis 12/02/2016   Social anxiety disorder 07/15/2011   History of depression 01/25/2011   Asthma, mild intermittent 01/25/2011    Past Medical History:  Diagnosis Date   Asthma    Depression    Psoriatic arthropathy (HCC)     Family History  Problem Relation Age of Onset   Diabetes Mother        type ll   Heart disease Maternal Grandmother    Diabetes Maternal Grandmother        type ll   Healthy Brother    Healthy Son    Healthy Daughter    Past Surgical History:  Procedure Laterality Date   MOLE REMOVAL  2004   VASECTOMY     WISDOM  TOOTH EXTRACTION     Social History   Social History Narrative   Not on file   Immunization History  Administered Date(s) Administered   Influenza,inj,Quad PF,6+ Mos 03/24/2019, 01/29/2020   Influenza-Unspecified 03/17/2016, 03/08/2017   PFIZER(Purple Top)SARS-COV-2 Vaccination 08/02/2019, 08/27/2019   Pneumococcal Polysaccharide-23 05/19/2021   Tdap 06/17/2014     Objective: Vital Signs: BP (!) 169/104 (BP Location: Left Arm, Patient Position: Sitting, Cuff Size: Large)   Pulse 92   Resp 17   Ht 5\' 10"  (1.778 m)   Wt 255 lb 12.8 oz (116 kg)   BMI  36.70 kg/m    Physical Exam Vitals and nursing note reviewed.  Constitutional:      Appearance: He is well-developed.  HENT:     Head: Normocephalic and atraumatic.  Eyes:     Conjunctiva/sclera: Conjunctivae normal.     Pupils: Pupils are equal, round, and reactive to light.  Cardiovascular:     Rate and Rhythm: Normal rate and regular rhythm.     Heart sounds: Normal heart sounds.  Pulmonary:     Effort: Pulmonary effort is normal.     Breath sounds: Normal breath sounds.  Abdominal:     General: Bowel sounds are normal.     Palpations: Abdomen is soft.  Musculoskeletal:     Cervical back: Normal range of motion and neck supple.  Skin:    General: Skin is warm and dry.     Capillary Refill: Capillary refill takes less than 2 seconds.     Comments: Plaque psoriasis noted on the extensor surface of both elbows and anterior surface of both knees.  1 patch of plaque psoriasis noted on the left lower leg.  Neurological:     Mental Status: He is alert and oriented to person, place, and time.  Psychiatric:        Behavior: Behavior normal.      Musculoskeletal Exam: C-spine, thoracic spine, lumbar spine have good range of motion.  No midline spinal tenderness.  No SI joint tenderness.  Shoulder joints, elbow joints, wrist joints, MCPs, PIPs, DIPs have good range of motion with no synovitis.  Complete fist formation bilaterally.  Hip joints have good range of motion with no groin pain.  Knee joints have good range of motion with no warmth or effusion.  Ankle joints have good range of motion with no tenderness or joint swelling.  No evidence of Achilles tendinitis or plantar fasciitis.  No tenderness or synovitis over MTP joints.  CDAI Exam: CDAI Score: -- Patient Global: --; Provider Global: -- Swollen: --; Tender: -- Joint Exam 12/09/2022   No joint exam has been documented for this visit   There is currently no information documented on the homunculus. Go to the Rheumatology  activity and complete the homunculus joint exam.  Investigation: No additional findings.  Imaging: No results found.  Recent Labs: Lab Results  Component Value Date   WBC 7.0 11/19/2022   HGB 15.9 11/19/2022   PLT 192 11/19/2022   NA 139 11/19/2022   K 4.6 11/19/2022   CL 102 11/19/2022   CO2 27 11/19/2022   GLUCOSE 100 (H) 11/19/2022   BUN 14 11/19/2022   CREATININE 0.98 11/19/2022   BILITOT 0.4 11/19/2022   ALKPHOS 69 05/19/2021   AST 18 11/19/2022   ALT 29 11/19/2022   PROT 6.9 11/19/2022   ALBUMIN 4.4 05/19/2021   CALCIUM 9.6 11/19/2022   GFRAA 109 06/16/2018   QFTBGOLDPLUS NEGATIVE 10/14/2022    Speciality Comments:  No specialty comments available.  Procedures:  No procedures performed Allergies: Penicillins   Assessment / Plan:     Visit Diagnoses: Psoriatic arthropathy (HCC): He has no synovitis or dactylitis on examination today.  No evidence of Achilles tendinitis, plantar fasciitis, patella tendinitis.  He has no SI joint tenderness upon palpation.  No morning stiffness or nocturnal pain.  No difficulty with ADLs.  He was initiated on Tremfya on 10/27/2022.  He is tolerating Tremfya without any side effects or injection site reactions.  He has not yet noticed any improvement in the skin clearance since initiating Tremfya but has not noticed any increased joint pain or joint swelling since switching from [place].  He will remain on Tremfya as prescribed.  We will reassess for the full efficacy in 2 to 3 months.  Plaque psoriasis: Plaque psoriasis noted on the extensor surface of both elbows and anterior surface of both knees.  1 plaque of psoriasis noted on the left lower leg.  Patient has a prescription for clobetasol cream but has not been using it recently.  He has been initiated on Tremfya on 10/27/2022 and has had a total of 2 doses so far.  He has not yet noticed any improvement since switching from [place] to [place] in regards to skin clearance.  He is willing to  give Tremfya more time.  He was advised to notify us if he notices any new or worsening symptoms.  High risk medication use - Tremfya 100 mg sq injections every 8 weeks.  Tremfya initiated on 10/27/2022. Inadequate response to Otezla 30 mg 1 tablet by mouth twice daily (started in February 2020). CBC and CMP updated on 11/19/22. His next lab work will be due in October and every 3 months.  TB gold negative on 10/14/22.  No recent or recurrent infections.  Discussed the importance of holding tremfya if he develops signs or symptoms of an infection and to resume once the infection has completely cleared.   Sacroiliitis (HCC) - X-rays were unremarkable on 07/28/20.  He has no SI joint tenderness upon palpation today.  No morning stiffness or nocturnal pain.  Injury of right knee, sequela: No warmth or effusion noted on exam.  Other medical conditions are listed as follows:  Family history of psoriasis  Social anxiety disorder  History of asthma  History of depression  Smoker  Orders: No orders of the defined types were placed in this encounter.  No orders of the defined types were placed in this encounter.    Follow-Up Instructions: Return in about 3 months (around 03/11/2023) for Psoriatic arthritis.   Gearldine Bienenstock, PA-C  Note - This record has been created using Dragon software.  Chart creation errors have been sought, but may not always  have been located. Such creation errors do not reflect on  the standard of medical care.

## 2022-12-09 ENCOUNTER — Encounter: Payer: Self-pay | Admitting: Physician Assistant

## 2022-12-09 ENCOUNTER — Ambulatory Visit: Payer: Managed Care, Other (non HMO) | Attending: Physician Assistant | Admitting: Physician Assistant

## 2022-12-09 VITALS — BP 152/96 | HR 93 | Resp 17 | Ht 70.0 in | Wt 255.8 lb

## 2022-12-09 DIAGNOSIS — Z8659 Personal history of other mental and behavioral disorders: Secondary | ICD-10-CM

## 2022-12-09 DIAGNOSIS — L400 Psoriasis vulgaris: Secondary | ICD-10-CM | POA: Diagnosis not present

## 2022-12-09 DIAGNOSIS — F4010 Social phobia, unspecified: Secondary | ICD-10-CM

## 2022-12-09 DIAGNOSIS — S8991XS Unspecified injury of right lower leg, sequela: Secondary | ICD-10-CM

## 2022-12-09 DIAGNOSIS — L4050 Arthropathic psoriasis, unspecified: Secondary | ICD-10-CM | POA: Diagnosis not present

## 2022-12-09 DIAGNOSIS — M461 Sacroiliitis, not elsewhere classified: Secondary | ICD-10-CM | POA: Diagnosis not present

## 2022-12-09 DIAGNOSIS — Z8709 Personal history of other diseases of the respiratory system: Secondary | ICD-10-CM

## 2022-12-09 DIAGNOSIS — Z79899 Other long term (current) drug therapy: Secondary | ICD-10-CM | POA: Diagnosis not present

## 2022-12-09 DIAGNOSIS — F17200 Nicotine dependence, unspecified, uncomplicated: Secondary | ICD-10-CM

## 2022-12-09 DIAGNOSIS — Z840 Family history of diseases of the skin and subcutaneous tissue: Secondary | ICD-10-CM

## 2022-12-09 NOTE — Patient Instructions (Signed)

## 2023-01-03 ENCOUNTER — Other Ambulatory Visit: Payer: Self-pay

## 2023-01-05 ENCOUNTER — Other Ambulatory Visit (HOSPITAL_COMMUNITY): Payer: Self-pay

## 2023-01-05 ENCOUNTER — Other Ambulatory Visit: Payer: Self-pay

## 2023-01-06 ENCOUNTER — Other Ambulatory Visit (HOSPITAL_COMMUNITY): Payer: Self-pay

## 2023-01-07 ENCOUNTER — Telehealth: Payer: Self-pay

## 2023-01-07 ENCOUNTER — Other Ambulatory Visit (HOSPITAL_COMMUNITY): Payer: Self-pay

## 2023-01-07 NOTE — Telephone Encounter (Addendum)
Received notification from [facility] that pt's copay card was showing as expired as of 10/22/22. Reached out and spoke with a rep of the copay card company who confirmed this. I stated that the card had only been active for 7 days and inquired how it could possibly have already expired, to which the rep responded that the card showed that it's active range was from 06/20/22 to 10/22/22. When I informed her that the patient had only just been enrolled into the program on 10/15/22, she simply repeated her previous statement and provided me with a phone number to provide to the pt so that he could re-enroll if needed.  Reached out to the pt and after a few rings I was sent to an automated VM message, however the line abruptly disconnected before given the option to leave a message. Tried call again and was immediately sent to a completely different VM box, but was able to leave a message providing a brief explanation of the situation, the phone number the pt needed to contact [phone]), and my personal office number should the pt have additional questions. I also informed the pt that the medication was currently $10 at the pharmacy if he was unable to re-enroll, or if he decided that he simply didn't want to follow up with the copay card company.  Will await potential response.

## 2023-01-11 ENCOUNTER — Other Ambulatory Visit (HOSPITAL_COMMUNITY): Payer: Self-pay

## 2023-01-25 ENCOUNTER — Other Ambulatory Visit (HOSPITAL_COMMUNITY): Payer: Self-pay

## 2023-01-25 ENCOUNTER — Telehealth: Payer: Self-pay

## 2023-01-25 ENCOUNTER — Other Ambulatory Visit: Payer: Self-pay

## 2023-01-25 DIAGNOSIS — L4050 Arthropathic psoriasis, unspecified: Secondary | ICD-10-CM

## 2023-01-25 DIAGNOSIS — L400 Psoriasis vulgaris: Secondary | ICD-10-CM

## 2023-01-25 DIAGNOSIS — Z79899 Other long term (current) drug therapy: Secondary | ICD-10-CM

## 2023-01-25 NOTE — Telephone Encounter (Signed)
Received notification from [facility] regarding a prior authorization for [facility]. Authorization has been APPROVED from 01/25/2023 to 01/24/2024. Approval letter sent to scan center.   Patient must fill through [facility]: [phone]  Authorization #  [id number]   Routing to [facility] for assistance having new Rx sent in

## 2023-01-25 NOTE — Telephone Encounter (Signed)
Patient reached out to [facility] and provided new copay card info as well as new insurance info. A new PA is required.  Submitted an URGENT Prior Authorization request to [facility] for [facility] via CoverMyMeds. Will update once we receive a response.  Key: [id number]

## 2023-01-26 ENCOUNTER — Other Ambulatory Visit (HOSPITAL_COMMUNITY): Payer: Self-pay

## 2023-01-26 MED ORDER — TREMFYA 100 MG/ML ~~LOC~~ SOAJ
SUBCUTANEOUS | 1 refills | Status: DC
Start: 2023-01-26 — End: 2023-05-04

## 2023-01-26 NOTE — Telephone Encounter (Signed)
Rx for Tremfya sent to [facility]. Spoke with patient and provided him with pharmacy phone number. Advised him to provide copay card information directly to pharmacy. He will follow-up and let us know if any issues  Chesley Mires, PharmD, MPH, BCPS, CPP Clinical Pharmacist (Rheumatology and Pulmonology)

## 2023-01-27 ENCOUNTER — Other Ambulatory Visit (HOSPITAL_COMMUNITY): Payer: Self-pay

## 2023-01-28 ENCOUNTER — Other Ambulatory Visit (HOSPITAL_COMMUNITY): Payer: Self-pay

## 2023-01-31 ENCOUNTER — Other Ambulatory Visit (HOSPITAL_COMMUNITY): Payer: Self-pay

## 2023-02-25 NOTE — Progress Notes (Deleted)
Office Visit Note  Patient: Donald Pearson             Date of Birth: 06/01/1975           MRN: [id number]             PCP: Kristian Covey, MD Referring: Kristian Covey, MD Visit Date: 03/11/2023 Occupation: @GUAROCC @  Subjective:  No chief complaint on file.   History of Present Illness: Donald Pearson is a 47 y.o. male ***     Activities of Daily Living:  Patient reports morning stiffness for *** {minute/hour:19697}.   Patient {ACTIONS;DENIES/REPORTS:21021675::"Denies"} nocturnal pain.  Difficulty dressing/grooming: {ACTIONS;DENIES/REPORTS:21021675::"Denies"} Difficulty climbing stairs: {ACTIONS;DENIES/REPORTS:21021675::"Denies"} Difficulty getting out of chair: {ACTIONS;DENIES/REPORTS:21021675::"Denies"} Difficulty using hands for taps, buttons, cutlery, and/or writing: {ACTIONS;DENIES/REPORTS:21021675::"Denies"}  No Rheumatology ROS completed.   PMFS History:  Patient Active Problem List   Diagnosis Date Noted   Psoriatic arthropathy (HCC) 06/28/2018   Family history of psoriasis 06/28/2018   Smoker 06/28/2018   Sacroiliitis (HCC) 06/28/2018   Plaque psoriasis 12/02/2016   Social anxiety disorder 07/15/2011   History of depression 01/25/2011   Asthma, mild intermittent 01/25/2011    Past Medical History:  Diagnosis Date   Asthma    Depression    Psoriatic arthropathy (HCC)     Family History  Problem Relation Age of Onset   Diabetes Mother        type ll   Heart disease Maternal Grandmother    Diabetes Maternal Grandmother        type ll   Healthy Brother    Healthy Son    Healthy Daughter    Past Surgical History:  Procedure Laterality Date   MOLE REMOVAL  2004   VASECTOMY     WISDOM TOOTH EXTRACTION     Social History   Social History Narrative   Not on file   Immunization History  Administered Date(s) Administered   Influenza,inj,Quad PF,6+ Mos 03/24/2019, 01/29/2020   Influenza-Unspecified 03/17/2016, 03/08/2017    PFIZER(Purple Top)SARS-COV-2 Vaccination 08/02/2019, 08/27/2019   Pneumococcal Polysaccharide-23 05/19/2021   Tdap 06/17/2014     Objective: Vital Signs: There were no vitals taken for this visit.   Physical Exam   Musculoskeletal Exam: ***  CDAI Exam: CDAI Score: -- Patient Global: --; Provider Global: -- Swollen: --; Tender: -- Joint Exam 03/11/2023   No joint exam has been documented for this visit   There is currently no information documented on the homunculus. Go to the Rheumatology activity and complete the homunculus joint exam.  Investigation: No additional findings.  Imaging: No results found.  Recent Labs: Lab Results  Component Value Date   WBC 7.0 11/19/2022   HGB 15.9 11/19/2022   PLT 192 11/19/2022   NA 139 11/19/2022   K 4.6 11/19/2022   CL 102 11/19/2022   CO2 27 11/19/2022   GLUCOSE 100 (H) 11/19/2022   BUN 14 11/19/2022   CREATININE 0.98 11/19/2022   BILITOT 0.4 11/19/2022   ALKPHOS 69 05/19/2021   AST 18 11/19/2022   ALT 29 11/19/2022   PROT 6.9 11/19/2022   ALBUMIN 4.4 05/19/2021   CALCIUM 9.6 11/19/2022   GFRAA 109 06/16/2018   QFTBGOLDPLUS NEGATIVE 10/14/2022    Speciality Comments: No specialty comments available.  Procedures:  No procedures performed Allergies: Penicillins   Assessment / Plan:     Visit Diagnoses: Psoriatic arthropathy (HCC)  Plaque psoriasis  High risk medication use  Sacroiliitis (HCC)  Injury of right knee, sequela  Family history of psoriasis  Social anxiety disorder  History of asthma  History of depression  Smoker  Orders: No orders of the defined types were placed in this encounter.  No orders of the defined types were placed in this encounter.   Face-to-face time spent with patient was *** minutes. Greater than 50% of time was spent in counseling and coordination of care.  Follow-Up Instructions: No follow-ups on file.   Gearldine Bienenstock, PA-C  Note - This record has been created  using Dragon software.  Chart creation errors have been sought, but may not always  have been located. Such creation errors do not reflect on  the standard of medical care.

## 2023-03-11 ENCOUNTER — Ambulatory Visit: Payer: Managed Care, Other (non HMO) | Admitting: Physician Assistant

## 2023-03-11 DIAGNOSIS — M461 Sacroiliitis, not elsewhere classified: Secondary | ICD-10-CM

## 2023-03-11 DIAGNOSIS — Z840 Family history of diseases of the skin and subcutaneous tissue: Secondary | ICD-10-CM

## 2023-03-11 DIAGNOSIS — Z8709 Personal history of other diseases of the respiratory system: Secondary | ICD-10-CM

## 2023-03-11 DIAGNOSIS — F4010 Social phobia, unspecified: Secondary | ICD-10-CM

## 2023-03-11 DIAGNOSIS — S8991XS Unspecified injury of right lower leg, sequela: Secondary | ICD-10-CM

## 2023-03-11 DIAGNOSIS — F17200 Nicotine dependence, unspecified, uncomplicated: Secondary | ICD-10-CM

## 2023-03-11 DIAGNOSIS — L400 Psoriasis vulgaris: Secondary | ICD-10-CM

## 2023-03-11 DIAGNOSIS — Z8659 Personal history of other mental and behavioral disorders: Secondary | ICD-10-CM

## 2023-03-11 DIAGNOSIS — Z79899 Other long term (current) drug therapy: Secondary | ICD-10-CM

## 2023-03-11 DIAGNOSIS — L4050 Arthropathic psoriasis, unspecified: Secondary | ICD-10-CM

## 2023-03-23 NOTE — Progress Notes (Signed)
Office Visit Note  Patient: Donald Pearson             Date of Birth: [birth year]           MRN: [id number]             PCP: Donald Covey, MD Referring: Donald Covey, MD Visit Date: 04/06/2023 Occupation: @GUAROCC @  Subjective:  Medication monitoring   History of Present Illness: Donald Pearson is a 47 y.o. male with history of psoriatic arthritis.  Patient remains on Tremfya 100 mg sq injections every 8 weeks. Tremfya initiated on 10/27/2022.  He is tolerating Tremfya without any side effects or injection site reactions.  He has not had any gaps in therapy.  Patient states that he has noticed about an 80 to 85% improvement in his skin clearance and joint inflammation since his last office visit.  The psoriasis on his elbows has improved.  He continues to have a patch of psoriasis on his left lower leg.  He has not needed to use clobetasol cream recently but has some on hand if he has an exacerbation of symptoms. Patient states about 3 weeks ago he was diagnosed with COVID-19.  He states that his symptoms have resolved but he has continued to have some residual fatigue.  He has been sleeping well at night.  He denies any other new medical conditions.    Activities of Daily Living:  Patient reports morning stiffness for 5-10 minutes.   Patient Denies nocturnal pain.  Difficulty dressing/grooming: Denies Difficulty climbing stairs: Denies Difficulty getting out of chair: Denies Difficulty using hands for taps, buttons, cutlery, and/or writing: Denies  Review of Systems  Constitutional:  Positive for fatigue.  HENT:  Negative for mouth sores and mouth dryness.   Eyes:  Negative for pain, redness, visual disturbance and dryness.  Respiratory:  Negative for shortness of breath.   Cardiovascular:  Negative for chest pain and palpitations.  Gastrointestinal:  Negative for blood in stool, constipation and diarrhea.  Endocrine: Negative for increased urination.  Genitourinary:   Negative for involuntary urination.  Musculoskeletal:  Positive for morning stiffness. Negative for joint pain, gait problem, joint pain, joint swelling, myalgias, muscle weakness, muscle tenderness and myalgias.  Skin:  Positive for rash. Negative for color change, hair loss and sensitivity to sunlight.  Allergic/Immunologic: Negative for susceptible to infections.  Neurological:  Negative for dizziness and headaches.  Hematological:  Negative for swollen glands.  Psychiatric/Behavioral:  Negative for depressed mood and sleep disturbance. The patient is not nervous/anxious.     PMFS History:  Patient Active Problem List   Diagnosis Date Noted   Psoriatic arthropathy (HCC) 06/28/2018   Family history of psoriasis 06/28/2018   Smoker 06/28/2018   Sacroiliitis (HCC) 06/28/2018   Plaque psoriasis 12/02/2016   Social anxiety disorder 07/15/2011   History of depression 01/25/2011   Asthma, mild intermittent 01/25/2011    Past Medical History:  Diagnosis Date   Asthma    Depression    Psoriatic arthropathy (HCC)     Family History  Problem Relation Age of Onset   Diabetes Mother        type ll   Heart disease Maternal Grandmother    Diabetes Maternal Grandmother        type ll   Healthy Brother    Healthy Son    Healthy Daughter    Past Surgical History:  Procedure Laterality Date   MOLE REMOVAL  2004   VASECTOMY  WISDOM TOOTH EXTRACTION     Social History   Social History Narrative   Not on file   Immunization History  Administered Date(s) Administered   Influenza,inj,Quad PF,6+ Mos 03/24/2019, 01/29/2020   Influenza-Unspecified 03/17/2016, 03/08/2017, 02/17/2023   PFIZER(Purple Top)SARS-COV-2 Vaccination 08/02/2019, 08/27/2019   Pneumococcal Polysaccharide-23 05/19/2021   Tdap 06/17/2014     Objective: Vital Signs: BP (!) 148/96 (BP Location: Left Arm, Patient Position: Sitting, Cuff Size: Large)   Pulse 97   Resp 17   Ht 5\' 10"  (1.778 m)   Wt 259 lb  (117.5 kg)   BMI 37.16 kg/m    Physical Exam Vitals and nursing note reviewed.  Constitutional:      Appearance: He is well-developed.  HENT:     Head: Normocephalic and atraumatic.  Eyes:     Conjunctiva/sclera: Conjunctivae normal.     Pupils: Pupils are equal, round, and reactive to light.  Cardiovascular:     Rate and Rhythm: Normal rate and regular rhythm.     Heart sounds: Normal heart sounds.  Pulmonary:     Effort: Pulmonary effort is normal.     Breath sounds: Normal breath sounds.  Abdominal:     General: Bowel sounds are normal.     Palpations: Abdomen is soft.  Musculoskeletal:     Cervical back: Normal range of motion and neck supple.  Skin:    General: Skin is warm and dry.     Capillary Refill: Capillary refill takes less than 2 seconds.  Neurological:     Mental Status: He is alert and oriented to person, place, and time.  Psychiatric:        Behavior: Behavior normal.      Musculoskeletal Exam: C-spine has good ROM.  No midline spinal tenderness.  No SI joint tenderness.  Shoulder joints have good ROM with some crepitus.  Elbow joints, wrist joints, MCPs, PIPss, and DIPs good ROM with no synovitis.  Complete fist formation bilaterally.  Knee joints have good ROM with no warmth or effusion.  Ankle joints have good ROM with no tenderness or joint swelling.   CDAI Exam: CDAI Score: -- Patient Global: --; Provider Global: -- Swollen: --; Tender: -- Joint Exam 04/06/2023   No joint exam has been documented for this visit   There is currently no information documented on the homunculus. Go to the Rheumatology activity and complete the homunculus joint exam.  Investigation: No additional findings.  Imaging: No results found.  Recent Labs: Lab Results  Component Value Date   WBC 7.0 11/19/2022   HGB 15.9 11/19/2022   PLT 192 11/19/2022   NA 139 11/19/2022   K 4.6 11/19/2022   CL 102 11/19/2022   CO2 27 11/19/2022   GLUCOSE 100 (H) 11/19/2022    BUN 14 11/19/2022   CREATININE 0.98 11/19/2022   BILITOT 0.4 11/19/2022   ALKPHOS 69 05/19/2021   AST 18 11/19/2022   ALT 29 11/19/2022   PROT 6.9 11/19/2022   ALBUMIN 4.4 05/19/2021   CALCIUM 9.6 11/19/2022   GFRAA 109 06/16/2018   QFTBGOLDPLUS NEGATIVE 10/14/2022    Speciality Comments: No specialty comments available.  Procedures:  No procedures performed Allergies: Penicillins    Assessment / Plan:     Visit Diagnoses: Psoriatic arthropathy (HCC): No synovitis or dactylitis noted on examination today.  No evidence of Achilles tendinitis or plantar fasciitis.  No SI joint tenderness upon palpation.  He has noticed an 80 to 85% improvement in his skin clearance since initiating  Tremfya.  No new patches of plaque psoriasis have developed.  He has a patch of psoriasis on the left lower leg but has not needed to use clobetasol cream recently.  He will remain on Tremfya 100 mg subcutaneous injections every 8 weeks.  No injection site reactions or side effects.  He will notify us if he develops any new or worsening symptoms.  He will follow-up in the office in 3 months or sooner if needed.  Plaque psoriasis: 80-85% improvement since initiating Tremfya.  Patch on left lower leg-improving.  Plaques on elbows have almost completely resolved. No new patches.   High risk medication use -Tremfya 100 mg sq injections every 8 weeks.  Tremfya initiated on 10/27/2022.  Inadequate response to Otezla 30 mg 1 tablet BID (started in February 2020).  CBC and CMP updated 11/19/22.  Orders for CBC and CMP released today.   His next lab work will be due in February and every 3 months to monitor for drug toxicity. TB gold negative on 10/14/22.  Patient recently was diagnosed with COVID-19.  He continues to have some residual fatigue.  No recurrent infections.  Discussed the importance of holding tremfya if he develops signs or symptoms of an infection and to resume once the infection has completely cleared.  -  Plan: COMPLETE METABOLIC PANEL WITH GFR, CBC with Differential/Platelet  Sacroiliitis (HCC) - X-rays were unremarkable on 07/28/20. No SI joint tenderness upon palpation today.   Other medical conditions are listed as follows:   Injury of right knee, sequela  Family history of psoriasis  Social anxiety disorder  History of asthma  History of depression  Smoker  Orders: Orders Placed This Encounter  Procedures   COMPLETE METABOLIC PANEL WITH GFR   CBC with Differential/Platelet   No orders of the defined types were placed in this encounter.   Follow-Up Instructions: Return in about 3 months (around 07/07/2023) for Psoriatic arthritis.   Gearldine Bienenstock, PA-C  Note - This record has been created using Dragon software.  Chart creation errors have been sought, but may not always  have been located. Such creation errors do not reflect on  the standard of medical care.

## 2023-03-28 ENCOUNTER — Telehealth: Payer: Self-pay | Admitting: Family Medicine

## 2023-03-28 NOTE — Telephone Encounter (Signed)
Pt would like a TOC from Dr Caryl Never to [employer]. He is retiring and wife sees Sam. Please advise.

## 2023-03-30 ENCOUNTER — Ambulatory Visit (INDEPENDENT_AMBULATORY_CARE_PROVIDER_SITE_OTHER): Payer: Self-pay | Admitting: Family Medicine

## 2023-03-30 ENCOUNTER — Encounter: Payer: Self-pay | Admitting: Family Medicine

## 2023-03-30 VITALS — BP 138/82 | HR 100 | Temp 97.7°F | Ht 70.0 in | Wt 262.8 lb

## 2023-03-30 DIAGNOSIS — R5383 Other fatigue: Secondary | ICD-10-CM

## 2023-03-30 DIAGNOSIS — R030 Elevated blood-pressure reading, without diagnosis of hypertension: Secondary | ICD-10-CM

## 2023-03-30 NOTE — Progress Notes (Signed)
Established Patient Office Visit  Subjective   Patient ID: Donald Pearson, male    DOB: [birth year]  Age: 47 y.o. MRN: [id number]  Chief Complaint  Patient presents with   Fatigue    Patient complains of fatigue, x2 weeks, Patient was diagnosed with Covid 2 weeks ago     HPI   Donald Pearson has history of plaque psoriasis, mild intermittent asthma, psoriatic arthropathy, and social anxiety disorder.  Seen today with couple week history of fatigue.  He does relate COVID diagnosis a couple weeks ago and overall feels improved from that.  Cough is almost resolved.  No fever.  No dyspnea.  No chest pains.  Appetite stable.  He feels like he is sleeping fairly well.  Denies any new specific stressors. He did get switched to [facility] over the summer and feels like his psoriasis is responding well to that.  Blood pressure is up initially here today 160/76 with no history of hypertension.  Denies any recent headaches or dizziness.  Past Medical History:  Diagnosis Date   Asthma    Depression    Psoriatic arthropathy (HCC)    Past Surgical History:  Procedure Laterality Date   MOLE REMOVAL  2004   VASECTOMY     WISDOM TOOTH EXTRACTION      reports that he has been smoking cigarettes. He started smoking about 18 years ago. He has a 5.7 pack-year smoking history. He has never been exposed to tobacco smoke. He has never used smokeless tobacco. He reports current alcohol use. He reports that he does not use drugs. family history includes Diabetes in his maternal grandmother and mother; Healthy in his brother, daughter, and son; Heart disease in his maternal grandmother. Allergies  Allergen Reactions   Penicillins     As a child, rash    Review of Systems  Constitutional:  Positive for malaise/fatigue. Negative for chills and fever.  Eyes:  Negative for blurred vision.  Respiratory:  Negative for shortness of breath and wheezing.   Cardiovascular:  Negative for chest pain.  Gastrointestinal:   Negative for abdominal pain.  Neurological:  Negative for dizziness, weakness and headaches.      Objective:     BP 138/82 (BP Location: Left Arm, Cuff Size: Large)   Pulse 100   Temp 97.7 F (36.5 C) (Oral)   Ht 5\' 10"  (1.778 m)   Wt 262 lb 12.8 oz (119.2 kg)   SpO2 98%   BMI 37.71 kg/m  BP Readings from Last 3 Encounters:  03/30/23 138/82  12/09/22 (!) 152/96  10/14/22 (!) 161/88   Wt Readings from Last 3 Encounters:  03/30/23 262 lb 12.8 oz (119.2 kg)  12/09/22 255 lb 12.8 oz (116 kg)  10/14/22 254 lb 3.2 oz (115.3 kg)      Physical Exam Vitals reviewed.  Constitutional:      Appearance: He is well-developed.  Neck:     Thyroid: No thyromegaly.  Cardiovascular:     Rate and Rhythm: Normal rate and regular rhythm.     Heart sounds: No murmur heard. Pulmonary:     Effort: Pulmonary effort is normal. No respiratory distress.     Breath sounds: Normal breath sounds. No wheezing or rales.  Musculoskeletal:     Cervical back: Neck supple.     Right lower leg: No edema.     Left lower leg: No edema.  Neurological:     General: No focal deficit present.     Mental Status: He is alert.  No results found for any visits on 03/30/23.  Last CBC Lab Results  Component Value Date   WBC 7.0 11/19/2022   HGB 15.9 11/19/2022   HCT 46.4 11/19/2022   MCV 91.3 11/19/2022   MCH 31.3 11/19/2022   RDW 12.1 11/19/2022   PLT 192 11/19/2022   Last metabolic panel Lab Results  Component Value Date   GLUCOSE 100 (H) 11/19/2022   NA 139 11/19/2022   K 4.6 11/19/2022   CL 102 11/19/2022   CO2 27 11/19/2022   BUN 14 11/19/2022   CREATININE 0.98 11/19/2022   EGFR 98 11/19/2022   CALCIUM 9.6 11/19/2022   PROT 6.9 11/19/2022   ALBUMIN 4.4 05/19/2021   BILITOT 0.4 11/19/2022   ALKPHOS 69 05/19/2021   AST 18 11/19/2022   ALT 29 11/19/2022      The 10-year ASCVD risk score (Arnett DK, et al., 2019) is: 7%    Assessment & Plan:   #1 fatigue.  This started  around the time of his COVID diagnosis couple weeks ago.  Suspect this is COVID-related.  We recommend getting this another couple weeks of observation and if not improving further at that time be in touch.  He does not have any specific concerns such as prolonged fever, significant cough, dyspnea, etc.  #2 elevated blood pressure without diagnosis of hypertension.  Repeat blood pressure left arm seated large cuff after rest 138/82.  We discussed nonpharmacologic management with weight loss, handout on DASH diet given, sodium reduction, close home monitoring.  Be in touch if not consistently less than 140/90 over the next several weeks.  He states his weight is actually down about 10 pounds from his top weight and he hopes to lose more.  Suspect his blood pressure will come down with additional weight loss of 8 to 10 pounds   Evelena Peat, MD

## 2023-03-30 NOTE — Patient Instructions (Signed)
Monitor BP at home and be in touch if consistently > 140/90

## 2023-04-06 ENCOUNTER — Ambulatory Visit: Payer: BC Managed Care – PPO | Attending: Physician Assistant | Admitting: Physician Assistant

## 2023-04-06 ENCOUNTER — Encounter: Payer: Self-pay | Admitting: Physician Assistant

## 2023-04-06 VITALS — BP 148/96 | HR 97 | Resp 17 | Ht 70.0 in | Wt 259.0 lb

## 2023-04-06 DIAGNOSIS — Z8709 Personal history of other diseases of the respiratory system: Secondary | ICD-10-CM

## 2023-04-06 DIAGNOSIS — L4050 Arthropathic psoriasis, unspecified: Secondary | ICD-10-CM

## 2023-04-06 DIAGNOSIS — S8991XS Unspecified injury of right lower leg, sequela: Secondary | ICD-10-CM

## 2023-04-06 DIAGNOSIS — M461 Sacroiliitis, not elsewhere classified: Secondary | ICD-10-CM

## 2023-04-06 DIAGNOSIS — L400 Psoriasis vulgaris: Secondary | ICD-10-CM | POA: Diagnosis not present

## 2023-04-06 DIAGNOSIS — Z8659 Personal history of other mental and behavioral disorders: Secondary | ICD-10-CM

## 2023-04-06 DIAGNOSIS — Z840 Family history of diseases of the skin and subcutaneous tissue: Secondary | ICD-10-CM

## 2023-04-06 DIAGNOSIS — F17200 Nicotine dependence, unspecified, uncomplicated: Secondary | ICD-10-CM

## 2023-04-06 DIAGNOSIS — F4010 Social phobia, unspecified: Secondary | ICD-10-CM

## 2023-04-06 DIAGNOSIS — Z79899 Other long term (current) drug therapy: Secondary | ICD-10-CM

## 2023-04-06 NOTE — Patient Instructions (Signed)

## 2023-04-07 LAB — CBC WITH DIFFERENTIAL/PLATELET
Absolute Lymphocytes: 1600 {cells}/uL (ref 850–3900)
Absolute Monocytes: 748 {cells}/uL (ref 200–950)
Basophils Absolute: 103 {cells}/uL (ref 0–200)
Basophils Relative: 1.2 %
Eosinophils Absolute: 284 {cells}/uL (ref 15–500)
Eosinophils Relative: 3.3 %
HCT: 47.5 % (ref 38.5–50.0)
Hemoglobin: 16.4 g/dL (ref 13.2–17.1)
MCH: 32.3 pg (ref 27.0–33.0)
MCHC: 34.5 g/dL (ref 32.0–36.0)
MCV: 93.7 fL (ref 80.0–100.0)
MPV: 10.5 fL (ref 7.5–12.5)
Monocytes Relative: 8.7 %
Neutro Abs: 5865 {cells}/uL (ref 1500–7800)
Neutrophils Relative %: 68.2 %
Platelets: 207 10*3/uL (ref 140–400)
RBC: 5.07 10*6/uL (ref 4.20–5.80)
RDW: 12 % (ref 11.0–15.0)
Total Lymphocyte: 18.6 %
WBC: 8.6 10*3/uL (ref 3.8–10.8)

## 2023-04-07 LAB — COMPLETE METABOLIC PANEL WITH GFR
AG Ratio: 2 (calc) (ref 1.0–2.5)
ALT: 32 U/L (ref 9–46)
AST: 23 U/L (ref 10–40)
Albumin: 4.8 g/dL (ref 3.6–5.1)
Alkaline phosphatase (APISO): 75 U/L (ref 36–130)
BUN: 12 mg/dL (ref 7–25)
CO2: 32 mmol/L (ref 20–32)
Calcium: 10.2 mg/dL (ref 8.6–10.3)
Chloride: 102 mmol/L (ref 98–110)
Creat: 1.04 mg/dL (ref 0.60–1.29)
Globulin: 2.4 g/dL (ref 1.9–3.7)
Glucose, Bld: 112 mg/dL — ABNORMAL HIGH (ref 65–99)
Potassium: 4.6 mmol/L (ref 3.5–5.3)
Sodium: 141 mmol/L (ref 135–146)
Total Bilirubin: 0.5 mg/dL (ref 0.2–1.2)
Total Protein: 7.2 g/dL (ref 6.1–8.1)
eGFR: 91 mL/min/{1.73_m2} (ref >=60)

## 2023-04-07 NOTE — Progress Notes (Signed)
Glucose is 112. Rest of CMP WNL.  CBC WNL.

## 2023-04-12 ENCOUNTER — Ambulatory Visit (INDEPENDENT_AMBULATORY_CARE_PROVIDER_SITE_OTHER): Payer: BC Managed Care – PPO | Admitting: Family Medicine

## 2023-04-12 VITALS — BP 156/82 | HR 90 | Temp 98.4°F | Ht 70.0 in | Wt 260.0 lb

## 2023-04-12 DIAGNOSIS — J069 Acute upper respiratory infection, unspecified: Secondary | ICD-10-CM

## 2023-04-12 DIAGNOSIS — R030 Elevated blood-pressure reading, without diagnosis of hypertension: Secondary | ICD-10-CM

## 2023-04-12 NOTE — Patient Instructions (Addendum)
Continue with weight loss efforts  Monitor blood pressure and be in touch if not consistently < 140/90 after 2-3 months.   Check with insurance regarding Colonoscopy screening coverage.

## 2023-04-12 NOTE — Progress Notes (Signed)
Established Patient Office Visit  Subjective   Patient ID: Donald Pearson, male    DOB: 11-Sep-1975  Age: 47 y.o. MRN: [id number]  Chief Complaint  Patient presents with   Cough    Patient complains of cough, x5 days, Tried Mucinex and Zyrtec   Nasal Congestion    Patient complains of nasal congestion, x5 days     HPI   Donald Pearson is seen with 5-day history of upper respiratory congestive symptoms He has nasal congestion and cough as predominant symptoms.  Had some mild sore throat initially.  Does feel like he is improving some today.  No fever.  Mild bodyaches.  He has a daughter who has had similar symptoms.  His main concern was getting clearance for travel to be around family for Thanksgiving.  He states his daughter was diagnosed with "walking pneumonia".  No obvious wheezing.  No dyspnea.  Elevated blood pressure.  He plans to start weight loss program in the next couple weeks and prefers lifestyle management with the following Dash eating plan over the next couple months to see if he can get this controlled without medication  Past Medical History:  Diagnosis Date   Asthma    Depression    Psoriatic arthropathy (HCC)    Past Surgical History:  Procedure Laterality Date   MOLE REMOVAL  2004   VASECTOMY     WISDOM TOOTH EXTRACTION      reports that he has been smoking cigarettes. He started smoking about 18 years ago. He has a 5.7 pack-year smoking history. He has never been exposed to tobacco smoke. He has never used smokeless tobacco. He reports current alcohol use. He reports that he does not use drugs. family history includes Diabetes in his maternal grandmother and mother; Healthy in his brother, daughter, and son; Heart disease in his maternal grandmother. Allergies  Allergen Reactions   Penicillins     As a child, rash    Review of Systems  Constitutional:  Negative for chills and fever.  HENT:  Positive for congestion. Negative for ear pain.   Respiratory:   Positive for cough.       Objective:     BP (!) 156/82 (BP Location: Left Arm, Patient Position: Sitting, Cuff Size: Normal)   Pulse 90   Temp 98.4 F (36.9 C) (Oral)   Ht 5\' 10"  (1.778 m)   Wt 260 lb (117.9 kg)   SpO2 96%   BMI 37.31 kg/m  BP Readings from Last 3 Encounters:  04/12/23 (!) 156/82  04/06/23 (!) 148/96  03/30/23 138/82   Wt Readings from Last 3 Encounters:  04/12/23 260 lb (117.9 kg)  04/06/23 259 lb (117.5 kg)  03/30/23 262 lb 12.8 oz (119.2 kg)      Physical Exam Vitals reviewed.  Constitutional:      General: He is not in acute distress.    Appearance: He is not toxic-appearing.  HENT:     Right Ear: Tympanic membrane normal.     Left Ear: Tympanic membrane normal.     Mouth/Throat:     Mouth: Mucous membranes are moist.     Pharynx: Oropharynx is clear.  Cardiovascular:     Rate and Rhythm: Normal rate and regular rhythm.  Pulmonary:     Effort: Pulmonary effort is normal.     Breath sounds: Normal breath sounds. No wheezing or rales.  Neurological:     Mental Status: He is alert.      No results found for  any visits on 04/12/23.    The 10-year ASCVD risk score (Arnett DK, et al., 2019) is: 9.2%    Assessment & Plan:   Problem List Items Addressed This Visit   None Visit Diagnoses     Viral URI with cough    -  Primary   Elevated blood pressure reading         Suspect viral URI with cough.  Nonfocal exam.  Recommend symptomatic management.  Continue Mucinex and Tylenol or Advil as needed for body aches.  Follow-up immediately for any fever or worsening symptoms  Regarding elevated blood pressure he prefers to month trial of following DASH diet along with weight loss and exercise.  If not consistently less than 140/90 over the next couple of months with lifestyle change recommend follow-up to initiate blood pressure medication  No follow-ups on file.    Evelena Peat, MD

## 2023-05-04 ENCOUNTER — Other Ambulatory Visit: Payer: Self-pay | Admitting: Physician Assistant

## 2023-05-04 DIAGNOSIS — L4050 Arthropathic psoriasis, unspecified: Secondary | ICD-10-CM

## 2023-05-04 DIAGNOSIS — Z79899 Other long term (current) drug therapy: Secondary | ICD-10-CM

## 2023-05-04 DIAGNOSIS — L400 Psoriasis vulgaris: Secondary | ICD-10-CM

## 2023-05-04 NOTE — Telephone Encounter (Signed)
Last Fill: 01/26/2023  Labs: 04/06/2023 Glucose is 112. Rest of CMP WNL CBC WNL  TB Gold: 10/14/2022 Neg    Next Visit: 07/07/2023  Last Visit: 04/06/2023  JY:[id number] arthropathy   Current Dose per office note 04/06/2023: Tremfya 100 mg sq injections every 8 weeks   Okay to refill Tremfyia?

## 2023-06-22 NOTE — Progress Notes (Deleted)
 Office Visit Note  Patient: Donald Pearson             Date of Birth: [birth year]           MRN: [id number]             PCP: Kristian Covey, MD Referring: Kristian Covey, MD Visit Date: 07/06/2023 Occupation: @GUAROCC @  Subjective:  No chief complaint on file.   History of Present Illness: Donald Pearson is a 47 y.o. male ***     Activities of Daily Living:  Patient reports morning stiffness for *** {minute/hour:19697}.   Patient {ACTIONS;DENIES/REPORTS:21021675::"Denies"} nocturnal pain.  Difficulty dressing/grooming: {ACTIONS;DENIES/REPORTS:21021675::"Denies"} Difficulty climbing stairs: {ACTIONS;DENIES/REPORTS:21021675::"Denies"} Difficulty getting out of chair: {ACTIONS;DENIES/REPORTS:21021675::"Denies"} Difficulty using hands for taps, buttons, cutlery, and/or writing: {ACTIONS;DENIES/REPORTS:21021675::"Denies"}  No Rheumatology ROS completed.   PMFS History:  Patient Active Problem List   Diagnosis Date Noted   Psoriatic arthropathy (HCC) 06/28/2018   Family history of psoriasis 06/28/2018   Smoker 06/28/2018   Sacroiliitis (HCC) 06/28/2018   Plaque psoriasis 12/02/2016   Social anxiety disorder 07/15/2011   History of depression 01/25/2011   Asthma, mild intermittent 01/25/2011    Past Medical History:  Diagnosis Date   Asthma    Depression    Psoriatic arthropathy (HCC)     Family History  Problem Relation Age of Onset   Diabetes Mother        type ll   Heart disease Maternal Grandmother    Diabetes Maternal Grandmother        type ll   Healthy Brother    Healthy Son    Healthy Daughter    Past Surgical History:  Procedure Laterality Date   MOLE REMOVAL  2004   VASECTOMY     WISDOM TOOTH EXTRACTION     Social History   Social History Narrative   Not on file   Immunization History  Administered Date(s) Administered   Influenza,inj,Quad PF,6+ Mos 03/24/2019, 01/29/2020   Influenza-Unspecified 03/17/2016, 03/08/2017, 02/17/2023    PFIZER(Purple Top)SARS-COV-2 Vaccination 08/02/2019, 08/27/2019   Pneumococcal Polysaccharide-23 05/19/2021   Tdap 06/17/2014     Objective: Vital Signs: There were no vitals taken for this visit.   Physical Exam   Musculoskeletal Exam: ***  CDAI Exam: CDAI Score: -- Patient Global: --; Provider Global: -- Swollen: --; Tender: -- Joint Exam 07/06/2023   No joint exam has been documented for this visit   There is currently no information documented on the homunculus. Go to the Rheumatology activity and complete the homunculus joint exam.  Investigation: No additional findings.  Imaging: No results found.  Recent Labs: Lab Results  Component Value Date   WBC 8.6 04/06/2023   HGB 16.4 04/06/2023   PLT 207 04/06/2023   NA 141 04/06/2023   K 4.6 04/06/2023   CL 102 04/06/2023   CO2 32 04/06/2023   GLUCOSE 112 (H) 04/06/2023   BUN 12 04/06/2023   CREATININE 1.04 04/06/2023   BILITOT 0.5 04/06/2023   ALKPHOS 69 05/19/2021   AST 23 04/06/2023   ALT 32 04/06/2023   PROT 7.2 04/06/2023   ALBUMIN 4.4 05/19/2021   CALCIUM 10.2 04/06/2023   GFRAA 109 06/16/2018   QFTBGOLDPLUS NEGATIVE 10/14/2022    Speciality Comments: No specialty comments available.  Procedures:  No procedures performed Allergies: Penicillins   Assessment / Plan:     Visit Diagnoses: Psoriatic arthropathy (HCC)  Plaque psoriasis  High risk medication use  Sacroiliitis (HCC)  Injury of right knee, sequela  Family history of  psoriasis  Social anxiety disorder  History of asthma  History of depression  Smoker  Orders: No orders of the defined types were placed in this encounter.  No orders of the defined types were placed in this encounter.   Face-to-face time spent with patient was *** minutes. Greater than 50% of time was spent in counseling and coordination of care.  Follow-Up Instructions: No follow-ups on file.   Gearldine Bienenstock, PA-C  Note - This record has been  created using Dragon software.  Chart creation errors have been sought, but may not always  have been located. Such creation errors do not reflect on  the standard of medical care.

## 2023-07-06 ENCOUNTER — Ambulatory Visit: Payer: BC Managed Care – PPO | Admitting: Physician Assistant

## 2023-07-06 DIAGNOSIS — Z79899 Other long term (current) drug therapy: Secondary | ICD-10-CM

## 2023-07-06 DIAGNOSIS — M461 Sacroiliitis, not elsewhere classified: Secondary | ICD-10-CM

## 2023-07-06 DIAGNOSIS — S8991XS Unspecified injury of right lower leg, sequela: Secondary | ICD-10-CM

## 2023-07-06 DIAGNOSIS — L400 Psoriasis vulgaris: Secondary | ICD-10-CM

## 2023-07-06 DIAGNOSIS — F17200 Nicotine dependence, unspecified, uncomplicated: Secondary | ICD-10-CM

## 2023-07-06 DIAGNOSIS — Z8709 Personal history of other diseases of the respiratory system: Secondary | ICD-10-CM

## 2023-07-06 DIAGNOSIS — Z840 Family history of diseases of the skin and subcutaneous tissue: Secondary | ICD-10-CM

## 2023-07-06 DIAGNOSIS — L4050 Arthropathic psoriasis, unspecified: Secondary | ICD-10-CM

## 2023-07-06 DIAGNOSIS — Z8659 Personal history of other mental and behavioral disorders: Secondary | ICD-10-CM

## 2023-07-06 DIAGNOSIS — F4010 Social phobia, unspecified: Secondary | ICD-10-CM

## 2023-07-07 ENCOUNTER — Ambulatory Visit: Payer: BC Managed Care – PPO | Admitting: Physician Assistant

## 2023-08-04 ENCOUNTER — Other Ambulatory Visit: Payer: Self-pay | Admitting: Family Medicine

## 2023-09-06 ENCOUNTER — Other Ambulatory Visit: Payer: Self-pay | Admitting: Physician Assistant

## 2023-09-06 DIAGNOSIS — Z79899 Other long term (current) drug therapy: Secondary | ICD-10-CM

## 2023-09-06 DIAGNOSIS — L400 Psoriasis vulgaris: Secondary | ICD-10-CM

## 2023-09-06 DIAGNOSIS — L4050 Arthropathic psoriasis, unspecified: Secondary | ICD-10-CM

## 2023-09-25 NOTE — Progress Notes (Signed)
 Office Visit Note  Patient: Donald Pearson             Date of Birth: 1976-02-28           MRN: [id number]             PCP: Marquetta Sit, MD Referring: Marquetta Sit, MD Visit Date: 10/06/2023 Occupation: @GUAROCC @  Subjective:  Medication monitoring   History of Present Illness: [name] is a 48 y.o. male with history of psoriatic arthritis.  Patient remains on  Tremfya  100 mg sq injections every 8 weeks. Tremfya  initiated on 10/27/2022.  He is tolerating Tremfya  without any side effects or injection site reactions.  Patient states that he is 7 weeks overdue for his dose of Tremfya  and would like a refill sent to the pharmacy today.  He is okay with having updated lab work today in the clinic.  Patient states that his psoriasis has been well-controlled on Tremfya .  He denies any increased joint pain or joint swelling at this time.  He denies any SI joint discomfort.  He denies any Achilles tendinitis or plantar fasciitis.  Patient states that about a month ago he had some swelling in the right knee and iced his knee which resolved his symptoms.  He had previously injured his right knee about a year ago.  He denies any other joint pain or joint swelling at this time.   Activities of Daily Living:  Patient reports morning stiffness for 15-20 minutes.   Patient Denies nocturnal pain.  Difficulty dressing/grooming: Denies Difficulty climbing stairs: Denies Difficulty getting out of chair: Denies Difficulty using hands for taps, buttons, cutlery, and/or writing: Denies  Review of Systems  Constitutional:  Positive for fatigue.  HENT:  Negative for mouth sores and mouth dryness.   Eyes:  Negative for dryness.  Respiratory:  Negative for shortness of breath.   Cardiovascular:  Negative for chest pain and palpitations.  Gastrointestinal:  Negative for blood in stool, constipation and diarrhea.  Endocrine: Negative for increased urination.  Genitourinary:  Negative for  involuntary urination.  Musculoskeletal:  Positive for joint pain, joint pain, joint swelling, myalgias, morning stiffness and myalgias. Negative for gait problem, muscle weakness and muscle tenderness.  Skin:  Positive for rash. Negative for color change, hair loss and sensitivity to sunlight.  Allergic/Immunologic: Negative for susceptible to infections.  Neurological:  Negative for dizziness and headaches.  Hematological:  Negative for swollen glands.  Psychiatric/Behavioral:  Positive for depressed mood. Negative for sleep disturbance. The patient is nervous/anxious.     PMFS History:  Patient Active Problem List   Diagnosis Date Noted   Psoriatic arthropathy (HCC) 06/28/2018   Family history of psoriasis 06/28/2018   Smoker 06/28/2018   Sacroiliitis (HCC) 06/28/2018   Plaque psoriasis 12/02/2016   Social anxiety disorder 07/15/2011   History of depression 01/25/2011   Asthma, mild intermittent 01/25/2011    Past Medical History:  Diagnosis Date   Asthma    Depression    Psoriatic arthropathy (HCC)     Family History  Problem Relation Age of Onset   Diabetes Mother        type ll   Heart disease Maternal Grandmother    Diabetes Maternal Grandmother        type ll   Healthy Brother    Healthy Son    Healthy Daughter    Past Surgical History:  Procedure Laterality Date   MOLE REMOVAL  2004   VASECTOMY  WISDOM TOOTH EXTRACTION     Social History   Social History Narrative   Not on file   Immunization History  Administered Date(s) Administered   Influenza,inj,Quad PF,6+ Mos 03/24/2019, 01/29/2020   Influenza-Unspecified 03/17/2016, 03/08/2017, 02/17/2023   PFIZER(Purple Top)SARS-COV-2 Vaccination 08/02/2019, 08/27/2019   Pneumococcal Polysaccharide-23 05/19/2021   Tdap 06/17/2014     Objective: Vital Signs: BP (!) 163/96 (BP Location: Left Arm, Patient Position: Sitting, Cuff Size: Normal)   Pulse 96   Resp 16   Ht 5\' 10"  (1.778 m)   Wt 264 lb 3.2 oz  (119.8 kg)   BMI 37.91 kg/m    Physical Exam Vitals and nursing note reviewed.  Constitutional:      Appearance: He is well-developed.  HENT:     Head: Normocephalic and atraumatic.  Eyes:     Conjunctiva/sclera: Conjunctivae normal.     Pupils: Pupils are equal, round, and reactive to light.  Cardiovascular:     Rate and Rhythm: Normal rate and regular rhythm.     Heart sounds: Normal heart sounds.  Pulmonary:     Effort: Pulmonary effort is normal.     Breath sounds: Normal breath sounds.  Abdominal:     General: Bowel sounds are normal.     Palpations: Abdomen is soft.  Musculoskeletal:     Cervical back: Normal range of motion and neck supple.  Skin:    General: Skin is warm and dry.     Capillary Refill: Capillary refill takes less than 2 seconds.     Comments: A few small plaques of psoriasis were noted on the left lower extremity.  Neurological:     Mental Status: He is alert and oriented to person, place, and time.  Psychiatric:        Behavior: Behavior normal.      Musculoskeletal Exam: C-spine has good range of motion.  No midline spinal tenderness.  No SI joint tenderness.  Shoulder joints, elbow joints, wrist joints, MCPs, PIPs, DIPs have good range of motion with no synovitis.  Complete fist formation bilaterally.  Hip joints have good range of motion with no groin pain.  Knee joints have good range of motion with no warmth or effusion.  Ankle joints have good range of motion with no tenderness or joint swelling.  No evidence of Achilles tendinitis or plantar fasciitis.  CDAI Exam: CDAI Score: -- Patient Global: --; Provider Global: -- Swollen: --; Tender: -- Joint Exam 10/06/2023   No joint exam has been documented for this visit   There is currently no information documented on the homunculus. Go to the Rheumatology activity and complete the homunculus joint exam.  Investigation: No additional findings.  Imaging: No results found.  Recent  Labs: Lab Results  Component Value Date   WBC 8.6 04/06/2023   HGB 16.4 04/06/2023   PLT 207 04/06/2023   NA 141 04/06/2023   K 4.6 04/06/2023   CL 102 04/06/2023   CO2 32 04/06/2023   GLUCOSE 112 (H) 04/06/2023   BUN 12 04/06/2023   CREATININE 1.04 04/06/2023   BILITOT 0.5 04/06/2023   ALKPHOS 69 05/19/2021   AST 23 04/06/2023   ALT 32 04/06/2023   PROT 7.2 04/06/2023   ALBUMIN 4.4 05/19/2021   CALCIUM 10.2 04/06/2023   GFRAA 109 06/16/2018   QFTBGOLDPLUS NEGATIVE 10/14/2022    Speciality Comments: No specialty comments available.  Procedures:  No procedures performed Allergies: Penicillins    Assessment / Plan:     Visit Diagnoses: Psoriatic  arthropathy [facility]): No synovitis or dactylitis noted on examination today.  He has no SI joint tenderness upon palpation today.  No evidence of Achilles tendinitis or plantar fasciitis.  Patient remains on Tremfya  100 mg sq injections every 8 weeks.  He is tolerating Tremfya  without any side effects or injection site reactions.  He is a few weeks overdue for his Tremfya  injection due to requiring updated lab work.  Orders for CBC and CMP were released today along with TB Gold.  A refill of Tremfya  will be sent to the pharmacy today.  He was advised to notify us  if he develops any new or worsening symptoms.  Follow-up in the office in 5 months or sooner if needed.  Plaque psoriasis: He has a few small plaques of psoriasis on the left lower leg.  He has not needed to use any topical agents recently.  Overall his psoriasis has improved significantly since initiating Tremfya .  He will remain on Tremfya  100 mg sq injections every 8 weeks.  A refill will be sent to the pharmacy today.  High risk medication use - Tremfya  100 mg sq injections every 8 weeks.  Tremfya  initiated on 10/27/2022.  Inadequate response to Otezla  30 mg 1 tablet BID (started in February 2020).  CBC and CMP updated on 04/06/23. Orders for CBC and CMP released today.  His next  lab work will be due at the end of August and every 3 months to monitor for drug toxicity. TB gold negative on 10/14/22. .Order for TB gold released today.   Discussed the importance of holding tremfya  if he develops signs or symptoms of an infection and to resume once the infection has completely cleared.   - Plan: CBC with Differential/Platelet, Comprehensive metabolic panel with GFR, QuantiFERON-TB Gold Plus  Screening for tuberculosis -Order for TB gold released today.  Plan: QuantiFERON-TB Gold Plus  Sacroiliitis (HCC) - X-rays were unremarkable on 07/28/20.  He has no SI joint tenderness upon palpation.  No nocturnal pain.  Injury of right knee, sequela: Patient has intermittent discomfort in the right knee.  About a month ago he had some increased swelling which resolved with ice. On examination he has good range of motion of the right knee with no warmth or swelling.  Other medical conditions are listed as follows:  Family history of psoriasis  Social anxiety disorder  History of asthma  History of depression  Smoker   Orders: Orders Placed This Encounter  Procedures   CBC with Differential/Platelet   Comprehensive metabolic panel with GFR   QuantiFERON-TB Gold Plus   No orders of the defined types were placed in this encounter.   Follow-Up Instructions: Return in about 5 months (around 03/07/2024) for Psoriatic arthritis.   Romayne Clubs, PA-C  Note - This record has been created using Dragon software.  Chart creation errors have been sought, but may not always  have been located. Such creation errors do not reflect on  the standard of medical care.

## 2023-10-05 ENCOUNTER — Ambulatory Visit: Admitting: Physician Assistant

## 2023-10-06 ENCOUNTER — Ambulatory Visit: Attending: Physician Assistant | Admitting: Physician Assistant

## 2023-10-06 ENCOUNTER — Other Ambulatory Visit: Payer: Self-pay

## 2023-10-06 ENCOUNTER — Encounter: Payer: Self-pay | Admitting: Physician Assistant

## 2023-10-06 VITALS — BP 153/94 | HR 79 | Resp 16 | Ht 70.0 in | Wt 264.2 lb

## 2023-10-06 DIAGNOSIS — S8991XS Unspecified injury of right lower leg, sequela: Secondary | ICD-10-CM

## 2023-10-06 DIAGNOSIS — L4050 Arthropathic psoriasis, unspecified: Secondary | ICD-10-CM

## 2023-10-06 DIAGNOSIS — Z111 Encounter for screening for respiratory tuberculosis: Secondary | ICD-10-CM

## 2023-10-06 DIAGNOSIS — M461 Sacroiliitis, not elsewhere classified: Secondary | ICD-10-CM | POA: Diagnosis not present

## 2023-10-06 DIAGNOSIS — Z8709 Personal history of other diseases of the respiratory system: Secondary | ICD-10-CM

## 2023-10-06 DIAGNOSIS — Z840 Family history of diseases of the skin and subcutaneous tissue: Secondary | ICD-10-CM

## 2023-10-06 DIAGNOSIS — L400 Psoriasis vulgaris: Secondary | ICD-10-CM | POA: Diagnosis not present

## 2023-10-06 DIAGNOSIS — F17200 Nicotine dependence, unspecified, uncomplicated: Secondary | ICD-10-CM

## 2023-10-06 DIAGNOSIS — Z8659 Personal history of other mental and behavioral disorders: Secondary | ICD-10-CM

## 2023-10-06 DIAGNOSIS — Z79899 Other long term (current) drug therapy: Secondary | ICD-10-CM | POA: Diagnosis not present

## 2023-10-06 DIAGNOSIS — F4010 Social phobia, unspecified: Secondary | ICD-10-CM

## 2023-10-06 MED ORDER — TREMFYA PEN 100 MG/ML ~~LOC~~ SOAJ: 100.0000 mg | SUBCUTANEOUS | 0 refills | Status: DC

## 2023-10-06 NOTE — Patient Instructions (Signed)
 Standing Labs We placed an order today for your standing lab work.   Please have your standing labs drawn at the end of August and every 3 months   Please have your labs drawn 2 weeks prior to your appointment so that the provider can discuss your lab results at your appointment, if possible.  Please note that you may see your imaging and lab results in MyChart before we have reviewed them. We will contact you once all results are reviewed. Please allow our office up to 72 hours to thoroughly review all of the results before contacting the office for clarification of your results.  WALK-IN LAB HOURS  Monday through Thursday from 8:00 am -12:30 pm and 1:00 pm-4:00 pm and Friday from 8:00 am-12:00 pm.  Patients with office visits requiring labs will be seen before walk-in labs.  You may encounter longer than normal wait times. Please allow additional time. Wait times may be shorter on  Monday and Thursday afternoons.  We do not book appointments for walk-in labs. We appreciate your patience and understanding with our staff.   Labs are drawn by [facility]. Please bring your co-pay at the time of your lab draw.  You may receive a bill from [facility] for your lab work.  Please note if you are on Hydroxychloroquine and and an order has been placed for a Hydroxychloroquine level,  you will need to have it drawn 4 hours or more after your last dose.  If you wish to have your labs drawn at another location, please call the office 24 hours in advance so we can fax the orders.  The office is located at [address], [address], [place], [place] [address]   If you have any questions regarding directions or hours of operation,  please call [phone].   As a reminder, please drink plenty of water prior to coming for your lab work. Thanks!

## 2023-10-06 NOTE — Telephone Encounter (Signed)
 Last Fill: 05/04/2023  Labs: 04/06/2023 Glucose is 112. Rest of CMP WNL CBC WNL (Pending labs updated at todays visit)  TB Gold: 10/14/2022 TB Gold Negative (pending update today)  Next Visit: 03/07/2024  Last Visit: 10/06/2023  EA:[id number] arthropathy   Current Dose per office note 10/06/2023: Tremfya  100 mg sq injections every 8 weeks   Okay to refill Tremfyia?

## 2023-10-07 ENCOUNTER — Ambulatory Visit: Payer: Self-pay | Admitting: Physician Assistant

## 2023-10-07 MED ORDER — TREMFYA ONE-PRESS 100 MG/ML ~~LOC~~ SOAJ
100.0000 mg | SUBCUTANEOUS | 0 refills | Status: DC
Start: 2023-10-07 — End: 2023-11-23

## 2023-10-07 NOTE — Addendum Note (Signed)
 Addended by: Thais Fill on: 10/07/2023 07:17 AM   Modules accepted: Orders

## 2023-10-07 NOTE — Progress Notes (Signed)
 CBC and CMP WNL

## 2023-10-10 LAB — CBC WITH DIFFERENTIAL/PLATELET
Absolute Lymphocytes: 1582 {cells}/uL (ref 850–3900)
Absolute Monocytes: 567 {cells}/uL (ref 200–950)
Basophils Absolute: 98 {cells}/uL (ref 0–200)
Basophils Relative: 1.4 %
Eosinophils Absolute: 210 {cells}/uL (ref 15–500)
Eosinophils Relative: 3 %
HCT: 47.1 % (ref 38.5–50.0)
Hemoglobin: 15.7 g/dL (ref 13.2–17.1)
MCH: 31.7 pg (ref 27.0–33.0)
MCHC: 33.3 g/dL (ref 32.0–36.0)
MCV: 95 fL (ref 80.0–100.0)
MPV: 10 fL (ref 7.5–12.5)
Monocytes Relative: 8.1 %
Neutro Abs: 4543 {cells}/uL (ref 1500–7800)
Neutrophils Relative %: 64.9 %
Platelets: 186 10*3/uL (ref 140–400)
RBC: 4.96 10*6/uL (ref 4.20–5.80)
RDW: 12.1 % (ref 11.0–15.0)
Total Lymphocyte: 22.6 %
WBC: 7 10*3/uL (ref 3.8–10.8)

## 2023-10-10 LAB — COMPREHENSIVE METABOLIC PANEL WITH GFR
AG Ratio: 1.9 (calc) (ref 1.0–2.5)
ALT: 33 U/L (ref 9–46)
AST: 20 U/L (ref 10–40)
Albumin: 4.6 g/dL (ref 3.6–5.1)
Alkaline phosphatase (APISO): 71 U/L (ref 36–130)
BUN: 13 mg/dL (ref 7–25)
CO2: 31 mmol/L (ref 20–32)
Calcium: 9.6 mg/dL (ref 8.6–10.3)
Chloride: 104 mmol/L (ref 98–110)
Creat: 0.95 mg/dL (ref 0.60–1.29)
Globulin: 2.4 g/dL (ref 1.9–3.7)
Glucose, Bld: 101 mg/dL — ABNORMAL HIGH (ref 65–99)
Potassium: 4.7 mmol/L (ref 3.5–5.3)
Sodium: 140 mmol/L (ref 135–146)
Total Bilirubin: 0.4 mg/dL (ref 0.2–1.2)
Total Protein: 7 g/dL (ref 6.1–8.1)
eGFR: 101 mL/min/{1.73_m2} (ref >=60)

## 2023-10-10 LAB — QUANTIFERON-TB GOLD PLUS
Mitogen-NIL: 7.35 [IU]/mL
NIL: 0.01 [IU]/mL
QuantiFERON-TB Gold Plus: NEGATIVE
TB1-NIL: 0 [IU]/mL
TB2-NIL: 0 [IU]/mL

## 2023-10-11 NOTE — Progress Notes (Signed)
 TB gold negative

## 2023-10-17 ENCOUNTER — Encounter: Payer: Self-pay | Admitting: Family Medicine

## 2023-10-17 ENCOUNTER — Ambulatory Visit (INDEPENDENT_AMBULATORY_CARE_PROVIDER_SITE_OTHER): Admitting: Family Medicine

## 2023-10-17 VITALS — BP 150/90 | HR 87 | Temp 98.8°F | Wt 262.5 lb

## 2023-10-17 DIAGNOSIS — I10 Essential (primary) hypertension: Secondary | ICD-10-CM | POA: Diagnosis not present

## 2023-10-17 DIAGNOSIS — R4189 Other symptoms and signs involving cognitive functions and awareness: Secondary | ICD-10-CM

## 2023-10-17 DIAGNOSIS — R5383 Other fatigue: Secondary | ICD-10-CM | POA: Diagnosis not present

## 2023-10-17 DIAGNOSIS — Z8659 Personal history of other mental and behavioral disorders: Secondary | ICD-10-CM | POA: Diagnosis not present

## 2023-10-17 MED ORDER — TELMISARTAN 40 MG PO TABS: 40.0000 mg | ORAL_TABLET | Freq: Every day | ORAL | 1 refills | Status: DC

## 2023-10-17 NOTE — Progress Notes (Signed)
 Established Patient Office Visit  Subjective   Patient ID: Donald Pearson, male    DOB: 1975/08/13  Age: 48 y.o. MRN: [id number]  Chief Complaint  Patient presents with   Fatigue    HPI   Donald Pearson has history of plaque psoriasis, mild intermittent asthma, psoriatic arthropathy, history of depression, social anxiety disorder.  Seen with 2-week history of some fatigue and brain fog.  He has been studied previously for obstructive sleep apnea was told he had mild apnea.  Not currently on CPAP.  Remains on Lexapro  10 mg daily.  He feels like for the most part his depression is relatively stable although he did score 16 today on PHQ-9.  No suicidal ideation.  He had recent labs couple weeks ago including comprehensive metabolic panel and CBC which were unremarkable-except for glucose of 101.  He is actually set up for physical and just a little over a week from now.  Feels like he is generally getting fairly good sleep at night usually about 8 hours.  Does not feel well rested though frequently when he wakes  Does have elevated blood pressure today without diagnosis of hypertension.  Had somewhat similar elevations last fall when he came in for URI symptoms  Past Medical History:  Diagnosis Date   Asthma    Depression    Psoriatic arthropathy (HCC)    Past Surgical History:  Procedure Laterality Date   MOLE REMOVAL  2004   VASECTOMY     WISDOM TOOTH EXTRACTION      reports that he has been smoking cigarettes. He started smoking about 19 years ago. He has a 5.8 pack-year smoking history. He has never been exposed to tobacco smoke. He has never used smokeless tobacco. He reports current alcohol use. He reports that he does not use drugs. family history includes Diabetes in his maternal grandmother and mother; Healthy in his brother, daughter, and son; Heart disease in his maternal grandmother. Allergies  Allergen Reactions   Penicillins     As a child, rash    Review of Systems   Constitutional:  Positive for malaise/fatigue.  Eyes:  Negative for blurred vision.  Respiratory:  Negative for shortness of breath.   Cardiovascular:  Negative for chest pain.  Neurological:  Negative for dizziness, weakness and headaches.      Objective:     BP (!) 150/90 (BP Location: Left Arm, Cuff Size: Normal)   Pulse 87   Temp 98.8 F (37.1 C) (Oral)   Wt 262 lb 8 oz (119.1 kg)   SpO2 96%   BMI 37.66 kg/m    Physical Exam Vitals reviewed.  Constitutional:      General: He is not in acute distress.    Appearance: He is not ill-appearing.  Cardiovascular:     Rate and Rhythm: Normal rate and regular rhythm.     Heart sounds: No murmur heard.    No gallop.  Pulmonary:     Effort: Pulmonary effort is normal.     Breath sounds: Normal breath sounds. No wheezing or rales.  Musculoskeletal:     Cervical back: Neck supple.     Right lower leg: No edema.     Left lower leg: No edema.  Lymphadenopathy:     Cervical: No cervical adenopathy.  Neurological:     General: No focal deficit present.     Mental Status: He is alert.      No results found for any visits on 10/17/23.  Last CBC Lab  Results  Component Value Date   WBC 7.0 10/06/2023   HGB 15.7 10/06/2023   HCT 47.1 10/06/2023   MCV 95.0 10/06/2023   MCH 31.7 10/06/2023   RDW 12.1 10/06/2023   PLT 186 10/06/2023   Last metabolic panel Lab Results  Component Value Date   GLUCOSE 101 (H) 10/06/2023   NA 140 10/06/2023   K 4.7 10/06/2023   CL 104 10/06/2023   CO2 31 10/06/2023   BUN 13 10/06/2023   CREATININE 0.95 10/06/2023   EGFR 101 10/06/2023   CALCIUM 9.6 10/06/2023   PROT 7.0 10/06/2023   ALBUMIN 4.4 05/19/2021   BILITOT 0.4 10/06/2023   ALKPHOS 69 05/19/2021   AST 20 10/06/2023   ALT 33 10/06/2023   Last lipids Lab Results  Component Value Date   CHOL 195 05/19/2021   HDL 41.40 05/19/2021   LDLCALC 122 (H) 05/19/2021   TRIG 160.0 (H) 05/19/2021   CHOLHDL 5 05/19/2021   Last  hemoglobin A1c Lab Results  Component Value Date   HGBA1C 5.6 05/19/2021   Last thyroid  functions Lab Results  Component Value Date   TSH 1.63 05/19/2021      The 10-year ASCVD risk score (Arnett DK, et al., 2019) is: 10.1%    Assessment & Plan:   #1   2-week history of reported "brain fog "and fatigue.  Recent CBC and CMP unremarkable.  Etiology unclear.  No recent medication changes.  Does have reported mild obstructive sleep apnea which is currently untreated.  Appetite and weight stable.  Does have elevated blood pressure as below which certainly could be contributing somewhat to his fatigue  #2 history of anxiety and depression symptoms.  Currently on Lexapro  10 mg daily.  Did score PHQ-9 of 16 today. We did discuss possible addition of Wellbutrin XL but will wait in reassess at physical next week  #3 elevated blood pressure.  This was significantly elevated today and has had other multiple similar elevations.  We discussed nonpharmacologic management with weight control, exercise, low-sodium diet.  Recommend initiating telmisartan 40 mg once daily and reassess at physical next week  Glean Lamy, MD

## 2023-10-26 ENCOUNTER — Ambulatory Visit (INDEPENDENT_AMBULATORY_CARE_PROVIDER_SITE_OTHER): Admitting: Family Medicine

## 2023-10-26 ENCOUNTER — Encounter: Payer: Self-pay | Admitting: Family Medicine

## 2023-10-26 VITALS — BP 128/60 | HR 85 | Temp 98.3°F | Ht 69.29 in | Wt 259.6 lb

## 2023-10-26 DIAGNOSIS — Z0000 Encounter for general adult medical examination without abnormal findings: Secondary | ICD-10-CM

## 2023-10-26 DIAGNOSIS — Z1211 Encounter for screening for malignant neoplasm of colon: Secondary | ICD-10-CM

## 2023-10-26 MED ORDER — BUPROPION HCL ER (XL) 150 MG PO TB24: 150.0000 mg | ORAL_TABLET | Freq: Every day | ORAL | 2 refills | Status: DC

## 2023-10-26 NOTE — Patient Instructions (Signed)
 Give me some feedback in 3-4 weeks regarding how the Wellbutrin is working.

## 2023-10-26 NOTE — Progress Notes (Signed)
 Established Patient Office Visit  Subjective   Patient ID: Donald Pearson, male    DOB: 07-10-1975  Age: 48 y.o. MRN: [id number]  Chief Complaint  Patient presents with   Annual Exam    HPI   Donald Pearson is seen today for physical exam.  He was here recently and had elevated blood pressure and we started telmisartan  40 mg daily.  He has had some constipation but otherwise tolerating well.  Blood pressures have been much improved.  Still has some lingering depression symptoms on Lexapro .  We previously discussed Wellbutrin and he would like to consider starting that.  He has low motivation at times.  Sometimes a low energy.  Still smokes 8 to 10 cigarettes/day.  Other medical problems include history of plaque psoriasis and sacroiliitis.  He had history of social anxiety disorder previously.  Health maintenance reviewed:  Health Maintenance  Topic Date Due   COVID-19 Vaccine (3 - Pfizer risk series) 09/24/2019   Colonoscopy  Never done   Pneumococcal Vaccine 47-78 Years old (2 of 2 - PCV) 10/28/2026 (Originally 05/19/2022)   INFLUENZA VACCINE  12/16/2023   DTaP/Tdap/Td (2 - Td or Tdap) 06/17/2024   Hepatitis C Screening  Completed   HIV Screening  Completed   HPV VACCINES  Aged Out   Meningococcal B Vaccine  Aged Out   - No history of screening colonoscopy.  He would like to proceed with that.  Family history and social history reviewed:  Social History   Socioeconomic History   Marital status: Married    Spouse name: Not on file   Number of children: Not on file   Years of education: Not on file   Highest education level: Bachelor's degree (e.g., BA, AB, BS)  Occupational History   Not on file  Tobacco Use   Smoking status: Every Day    Current packs/day: 0.30    Average packs/day: 0.3 packs/day for 19.4 years (5.8 ttl pk-yrs)    Types: Cigarettes    Start date: 2006    Passive exposure: Never   Smokeless tobacco: Never   Tobacco comments:    smokes 1/4 pack per day  09/15/2020  Vaping Use   Vaping status: Former  Substance and Sexual Activity   Alcohol use: Yes    Comment: 2 daily   Drug use: No   Sexual activity: Not on file  Other Topics Concern   Not on file  Social History Narrative   Not on file   Social Drivers of Health   Financial Resource Strain: Low Risk  (10/16/2023)   Overall Financial Resource Strain (CARDIA)    Difficulty of Paying Living Expenses: Not hard at all  Food Insecurity: No Food Insecurity (10/16/2023)   Hunger Vital Sign    Worried About Running Out of Food in the Last Year: Never true    Ran Out of Food in the Last Year: Never true  Transportation Needs: No Transportation Needs (10/16/2023)   PRAPARE - Administrator, Civil Service (Medical): No    Lack of Transportation (Non-Medical): No  Physical Activity: Insufficiently Active (10/16/2023)   Exercise Vital Sign    Days of Exercise per Week: 3 days    Minutes of Exercise per Session: 20 min  Stress: Stress Concern Present (10/16/2023)   [employer] of Occupational Health - Occupational Stress Questionnaire    Feeling of Stress : Rather much  Social Connections: Socially Isolated (10/16/2023)   Social Connection and Isolation Panel [NHANES]  Frequency of Communication with Friends and Family: Never    Frequency of Social Gatherings with Friends and Family: Once a week    Attends Religious Services: Never    Database administrator or Organizations: No    Attends Engineer, structural: Not on file    Marital Status: Married  Intimate Partner Violence: Unknown (08/21/2021)   Received from [employer], [facility]   HITS    Physically Hurt: Not on file    Insult or Talk Down To: Not on file    Threaten Physical Harm: Not on file    Scream or Curse: Not on file   Family History  Problem Relation Age of Onset   Diabetes Mother        type ll   Heart disease Maternal Grandmother    Diabetes Maternal Grandmother        type ll   Healthy  Brother    Healthy Son    Healthy Daughter    - Father's health is essentially unknown  Past Medical History:  Diagnosis Date   Asthma    Depression    Psoriatic arthropathy (HCC)    Past Surgical History:  Procedure Laterality Date   MOLE REMOVAL  2004   VASECTOMY     WISDOM TOOTH EXTRACTION      reports that he has been smoking cigarettes. He started smoking about 19 years ago. He has a 5.8 pack-year smoking history. He has never been exposed to tobacco smoke. He has never used smokeless tobacco. He reports current alcohol use. He reports that he does not use drugs. family history includes Diabetes in his maternal grandmother and mother; Healthy in his brother, daughter, and son; Heart disease in his maternal grandmother. Allergies  Allergen Reactions   Penicillins     As a child, rash    Review of Systems  Constitutional:  Negative for malaise/fatigue.  Eyes:  Negative for blurred vision.  Respiratory:  Negative for shortness of breath.   Cardiovascular:  Negative for chest pain.  Gastrointestinal:  Positive for constipation. Negative for abdominal pain.  Genitourinary:  Negative for dysuria.  Neurological:  Negative for dizziness, weakness and headaches.  Psychiatric/Behavioral:  Positive for depression.       Objective:     BP 128/60 (BP Location: Left Arm, Cuff Size: Normal)   Pulse 85   Temp 98.3 F (36.8 C) (Oral)   Ht 5' 9.29 (1.76 m)   Wt 259 lb 9.6 oz (117.8 kg)   SpO2 95%   BMI 38.01 kg/m  BP Readings from Last 3 Encounters:  10/26/23 128/60  10/17/23 (!) 150/90  10/06/23 (!) 153/94   Wt Readings from Last 3 Encounters:  10/26/23 259 lb 9.6 oz (117.8 kg)  10/17/23 262 lb 8 oz (119.1 kg)  10/06/23 264 lb 3.2 oz (119.8 kg)      Physical Exam Vitals reviewed.  Constitutional:      General: He is not in acute distress.    Appearance: He is well-developed. He is not ill-appearing.  HENT:     Head: Normocephalic and atraumatic.     Right  Ear: External ear normal.     Left Ear: External ear normal.  Eyes:     Conjunctiva/sclera: Conjunctivae normal.     Pupils: Pupils are equal, round, and reactive to light.  Neck:     Thyroid : No thyromegaly.  Cardiovascular:     Rate and Rhythm: Normal rate and regular rhythm.  Heart sounds: Normal heart sounds. No murmur heard. Pulmonary:     Effort: No respiratory distress.     Breath sounds: No wheezing or rales.  Abdominal:     General: Bowel sounds are normal. There is no distension.     Palpations: Abdomen is soft. There is no mass.     Tenderness: There is no abdominal tenderness. There is no guarding or rebound.  Musculoskeletal:     Cervical back: Normal range of motion and neck supple.  Lymphadenopathy:     Cervical: No cervical adenopathy.  Skin:    Findings: No rash.  Neurological:     Mental Status: He is alert and oriented to person, place, and time.     Cranial Nerves: No cranial nerve deficit.      No results found for any visits on 10/26/23.  Last CBC Lab Results  Component Value Date   WBC 7.0 10/06/2023   HGB 15.7 10/06/2023   HCT 47.1 10/06/2023   MCV 95.0 10/06/2023   MCH 31.7 10/06/2023   RDW 12.1 10/06/2023   PLT 186 10/06/2023   Last metabolic panel Lab Results  Component Value Date   GLUCOSE 101 (H) 10/06/2023   NA 140 10/06/2023   K 4.7 10/06/2023   CL 104 10/06/2023   CO2 31 10/06/2023   BUN 13 10/06/2023   CREATININE 0.95 10/06/2023   EGFR 101 10/06/2023   CALCIUM 9.6 10/06/2023   PROT 7.0 10/06/2023   ALBUMIN 4.4 05/19/2021   BILITOT 0.4 10/06/2023   ALKPHOS 69 05/19/2021   AST 20 10/06/2023   ALT 33 10/06/2023      The 10-year ASCVD risk score (Arnett DK, et al., 2019) is: 7.7%    Assessment & Plan:   Problem List Items Addressed This Visit   None Visit Diagnoses       Colon cancer screening    -  Primary   Relevant Orders   Ambulatory referral to Gastroenterology     Physical exam       Relevant Orders    Hemoglobin A1c   Lipid panel     Yarnell is seen today for physical exam.  Recently diagnosed hypertension improved significantly today on telmisartan  40 mg daily.  Continue current dosage.  He has history of depression and we discussed adding low-dose Wellbutrin XL 150 mg a day to his Lexapro .  He will give feedback in 3 to 4 weeks regarding how he is doing with this.  Obtain labs as above.  Not obtaining CBC or CMP secondary to recently being done per other provider.  -Did recommend setting up initial screening colonoscopy and he agrees - Strongly encouraged to stop smoking completely.  Currently smokes 8 to 10 cigarettes/day - We also advised more regular activity physically such as brisk walking 30 minutes 5 times per week - He is encouraged to try to lose some weight - Discussed lifestyle management of constipation with plenty of fluids, fiber least 30 g/day, increase walking and if necessary add MiraLAX  No follow-ups on file.    Glean Lamy, MD

## 2023-10-27 ENCOUNTER — Encounter: Payer: Self-pay | Admitting: Internal Medicine

## 2023-10-27 ENCOUNTER — Ambulatory Visit: Payer: Self-pay | Admitting: Family Medicine

## 2023-10-27 ENCOUNTER — Telehealth: Payer: Self-pay

## 2023-10-27 LAB — HEMOGLOBIN A1C
Hgb A1c MFr Bld: 5.5 % (ref <5.7)
Mean Plasma Glucose: 111 mg/dL
eAG (mmol/L): 6.2 mmol/L

## 2023-10-27 LAB — LIPID PANEL
Cholesterol: 182 mg/dL (ref <200)
HDL: 43 mg/dL (ref >=40)
LDL Cholesterol (Calc): 115 mg/dL — ABNORMAL HIGH
Non-HDL Cholesterol (Calc): 139 mg/dL — ABNORMAL HIGH (ref <130)
Total CHOL/HDL Ratio: 4.2 (calc) (ref <5.0)
Triglycerides: 127 mg/dL (ref <150)

## 2023-10-27 NOTE — Telephone Encounter (Signed)
 Copied from CRM [id number]. Topic: Clinical - Lab/Test Results >> Oct 27, 2023 12:42 PM Alysia Jumbo S wrote: Reason for CRM: Patient returning missed call regarding lab results. Relayed results per provider note, verbatim. Patient verbalized understanding and no additional questions at this time.

## 2023-10-27 NOTE — Telephone Encounter (Signed)
Noted Please see result note 

## 2023-11-17 ENCOUNTER — Other Ambulatory Visit: Payer: Self-pay | Admitting: Family Medicine

## 2023-11-23 ENCOUNTER — Other Ambulatory Visit: Payer: Self-pay | Admitting: Physician Assistant

## 2023-11-23 DIAGNOSIS — L4050 Arthropathic psoriasis, unspecified: Secondary | ICD-10-CM

## 2023-11-23 DIAGNOSIS — L400 Psoriasis vulgaris: Secondary | ICD-10-CM

## 2023-11-23 NOTE — Telephone Encounter (Signed)
 Last Fill: 10/07/2023  Labs: 10/06/2023 CBC and CMP WNL   TB Gold: 10/06/2023 Neg    Next Visit: 03/07/2024  Last Visit: 10/06/2023  IK:Ednmpjupr arthropathy   Current Dose per office note 10/06/2023: Tremfya  100 mg sq injections every 8 weeks   Okay to refill Tremfyia?

## 2023-11-30 ENCOUNTER — Encounter: Payer: Self-pay | Admitting: Internal Medicine

## 2023-11-30 ENCOUNTER — Ambulatory Visit (AMBULATORY_SURGERY_CENTER)

## 2023-11-30 VITALS — Ht 69.0 in | Wt 260.0 lb

## 2023-11-30 DIAGNOSIS — Z1211 Encounter for screening for malignant neoplasm of colon: Secondary | ICD-10-CM

## 2023-11-30 MED ORDER — NA SULFATE-K SULFATE-MG SULF 17.5-3.13-1.6 GM/177ML PO SOLN: 1.0000 | Freq: Once | ORAL | 0 refills | Status: AC

## 2023-11-30 NOTE — Progress Notes (Signed)
 No egg or soy allergy known to patient  No issues known to pt with past sedation with any surgeries or procedures Patient denies ever being told they had issues or difficulty with intubation  No FH of Malignant Hyperthermia Pt is not on diet pills Pt is not on  home 02  Pt is not on blood thinners  Pt has issues with constipation and takes fiber capsules No A fib or A flutter Have any cardiac testing pending--no Pt can ambulate independently Pt denies use of chewing tobacco Discussed diabetic I weight loss medication holds Discussed NSAID holds Checked BMI Pt instructed to use Singlecare.com or GoodRx for a price reduction on prep  Patient's chart reviewed by Norleen Schillings CNRA prior to previsit and patient appropriate for the [facility].  Pre visit completed and red dot placed by patient's name on their procedure day (on provider's schedule).

## 2023-12-07 ENCOUNTER — Ambulatory Visit: Admitting: Internal Medicine

## 2023-12-07 ENCOUNTER — Encounter: Payer: Self-pay | Admitting: Internal Medicine

## 2023-12-07 VITALS — BP 126/78 | HR 73 | Temp 97.6°F | Resp 13 | Ht 69.0 in | Wt 260.0 lb

## 2023-12-07 DIAGNOSIS — D123 Benign neoplasm of transverse colon: Secondary | ICD-10-CM | POA: Diagnosis not present

## 2023-12-07 DIAGNOSIS — K635 Polyp of colon: Secondary | ICD-10-CM | POA: Diagnosis not present

## 2023-12-07 DIAGNOSIS — Z1211 Encounter for screening for malignant neoplasm of colon: Secondary | ICD-10-CM | POA: Diagnosis not present

## 2023-12-07 DIAGNOSIS — K648 Other hemorrhoids: Secondary | ICD-10-CM

## 2023-12-07 DIAGNOSIS — D122 Benign neoplasm of ascending colon: Secondary | ICD-10-CM

## 2023-12-07 DIAGNOSIS — K5730 Diverticulosis of large intestine without perforation or abscess without bleeding: Secondary | ICD-10-CM

## 2023-12-07 DIAGNOSIS — D125 Benign neoplasm of sigmoid colon: Secondary | ICD-10-CM

## 2023-12-07 MED ORDER — SODIUM CHLORIDE 0.9 % IV SOLN: 500.0000 mL | Freq: Once | INTRAVENOUS | Status: DC

## 2023-12-07 NOTE — Progress Notes (Signed)
 Called to room to assist during endoscopic procedure.  Patient ID and intended procedure confirmed with present staff. Received instructions for my participation in the procedure from the performing physician.

## 2023-12-07 NOTE — Progress Notes (Signed)
 A/o x 3, VSS, gd SR's, pleased with anesthesia, report to RN

## 2023-12-07 NOTE — Progress Notes (Signed)
 Pt's states no medical or surgical changes since previsit or office visit.

## 2023-12-07 NOTE — Op Note (Signed)
 [facility] Patient Name: Donald Pearson Procedure Date: 12/07/2023 10:57 AM MRN: [id number] Endoscopist: Norleen SAILOR. Abran , MD, [id number] Age: 48 Referring MD:  Date of Birth: [birth year] Gender: Male Account #: [id number] Procedure:                Colonoscopy with cold snare polypectomy x 3 Indications:              Screening for colorectal malignant neoplasm Medicines:                Monitored Anesthesia Care Procedure:                Pre-Anesthesia Assessment:                           - Prior to the procedure, a History and Physical                            was performed, and patient medications and                            allergies were reviewed. The patient's tolerance of                            previous anesthesia was also reviewed. The risks                            and benefits of the procedure and the sedation                            options and risks were discussed with the patient.                            All questions were answered, and informed consent                            was obtained. Prior Anticoagulants: The patient has                            taken no anticoagulant or antiplatelet agents. ASA                            Grade Assessment: II - A patient with mild systemic                            disease. After reviewing the risks and benefits,                            the patient was deemed in satisfactory condition to                            undergo the procedure.                           After obtaining informed consent, the colonoscope  was passed under direct vision. Throughout the                            procedure, the patient's blood pressure, pulse, and                            oxygen saturations were monitored continuously. The                            CF [id number] #[id number] was introduced through the anus                            and advanced to the the cecum, identified by                             appendiceal orifice and ileocecal valve. The                            ileocecal valve, appendiceal orifice, and rectum                            were photographed. The quality of the bowel                            preparation was excellent. The colonoscopy was                            performed without difficulty. The patient tolerated                            the procedure well. The bowel preparation used was                            SUPREP via split dose instruction. Scope In: 11:27:28 AM Scope Out: 11:46:37 AM Scope Withdrawal Time: 0 hours 15 minutes 22 seconds  Total Procedure Duration: 0 hours 19 minutes 9 seconds  Findings:                 Three polyps were found in the sigmoid colon,                            transverse colon and ascending colon. The polyps                            were 3 to 6 mm in size. These polyps were removed                            with a cold snare. Resection and retrieval were                            complete.                           Multiple diverticula were found in the left colon.  Internal hemorrhoids were found during retroflexion.                           The exam was otherwise without abnormality on                            direct and retroflexion views. Complications:            No immediate complications. Estimated blood loss:                            None. Estimated Blood Loss:     Estimated blood loss: none. Impression:               - Three 3 to 6 mm polyps in the sigmoid colon, in                            the transverse colon and in the ascending colon,                            removed with a cold snare. Resected and retrieved.                           - Diverticulosis in the left colon.                           - Internal hemorrhoids.                           - The examination was otherwise normal on direct                            and retroflexion  views. Recommendation:           - Repeat colonoscopy in 5 years for surveillance.                           - Patient has a contact number available for                            emergencies. The signs and symptoms of potential                            delayed complications were discussed with the                            patient. Return to normal activities tomorrow.                            Written discharge instructions were provided to the                            patient.                           - Resume previous diet.                           -  Continue present medications.                           - Await pathology results. Norleen SAILOR. Abran, MD 12/07/2023 11:53:10 AM This report has been signed electronically.

## 2023-12-07 NOTE — Patient Instructions (Addendum)
-   3 polyps removed and sent to pathology  - Repeat colonoscopy in 5 years for surveillance.  - Resume previous diet.  - Continue present medications.  - Await pathology results.  YOU HAD AN ENDOSCOPIC PROCEDURE TODAY AT THE [facility]:   Refer to the procedure report that was given to you for any specific questions about what was found during the examination.  If the procedure report does not answer your questions, please call your gastroenterologist to clarify.  If you requested that your care partner not be given the details of your procedure findings, then the procedure report has been included in a sealed envelope for you to review at your convenience later.  YOU SHOULD EXPECT: Some feelings of bloating in the abdomen. Passage of more gas than usual.  Walking can help get rid of the air that was put into your GI tract during the procedure and reduce the bloating. If you had a lower endoscopy (such as a colonoscopy or flexible sigmoidoscopy) you may notice spotting of blood in your stool or on the toilet paper. If you underwent a bowel prep for your procedure, you may not have a normal bowel movement for a few days.  Please Note:  You might notice some irritation and congestion in your nose or some drainage.  This is from the oxygen used during your procedure.  There is no need for concern and it should clear up in a day or so.  SYMPTOMS TO REPORT IMMEDIATELY:  Following lower endoscopy (colonoscopy or flexible sigmoidoscopy):  Excessive amounts of blood in the stool  Significant tenderness or worsening of abdominal pains  Swelling of the abdomen that is new, acute  Fever of 100F or higher  For urgent or emergent issues, a gastroenterologist can be reached at any hour by calling [phone]. Do not use MyChart messaging for urgent concerns.    DIET:  We do recommend a small meal at first, but then you may proceed to your regular diet.  Drink plenty of fluids but you  should avoid alcoholic beverages for 24 hours.  ACTIVITY:  You should plan to take it easy for the rest of today and you should NOT DRIVE or use heavy machinery until tomorrow (because of the sedation medicines used during the test).    FOLLOW UP: Our staff will call the number listed on your records the next business day following your procedure.  We will call around 7:15- 8:00 am to check on you and address any questions or concerns that you may have regarding the information given to you following your procedure. If we do not reach you, we will leave a message.     If any biopsies were taken you will be contacted by phone or by letter within the next 1-3 weeks.  Please call us  at [phone] if you have not heard about the biopsies in 3 weeks.    SIGNATURES/CONFIDENTIALITY: You and/or your care partner have signed paperwork which will be entered into your electronic medical record.  These signatures attest to the fact that that the information above on your After Visit Summary has been reviewed and is understood.  Full responsibility of the confidentiality of this discharge information lies with you and/or your care-partner.

## 2023-12-07 NOTE — Progress Notes (Signed)
 HISTORY OF PRESENT ILLNESS:  Donald Pearson is a 48 y.o. male sent directly for routine screening colonoscopy.  No complaints  REVIEW OF SYSTEMS:  All non-GI ROS negative except for  Past Medical History:  Diagnosis Date   Allergy    Anxiety    Asthma    Depression    Hypertension    Psoriatic arthropathy (HCC)    Sleep apnea     Past Surgical History:  Procedure Laterality Date   MOLE REMOVAL  2004   VASECTOMY     WISDOM TOOTH EXTRACTION      Social History Donald Pearson  reports that he has been smoking cigarettes. He started smoking about 19 years ago. He has a 5.9 pack-year smoking history. He has never been exposed to tobacco smoke. He has never used smokeless tobacco. He reports current alcohol use. He reports that he does not use drugs.  family history includes Alcohol abuse in his mother; Asthma in his daughter and maternal aunt; Cancer in his maternal aunt; Diabetes in his maternal grandmother and mother; Drug abuse in his mother; Healthy in his brother and son; Heart disease in his maternal grandmother; Hypertension in his mother.  Allergies  Allergen Reactions   Penicillins Rash    As a child, rash       PHYSICAL EXAMINATION: Vital signs: BP 116/75   Pulse 74   Temp 97.6 F (36.4 C) (Temporal)   Resp (!) 0   Ht 5' 9 (1.753 m)   Wt 260 lb (117.9 kg)   SpO2 94%   BMI 38.40 kg/m  General: Well-developed, well-nourished, no acute distress HEENT: Sclerae are anicteric, conjunctiva pink. Oral mucosa intact Lungs: Clear Heart: Regular Abdomen: soft, nontender, nondistended, no obvious ascites, no peritoneal signs, normal bowel sounds. No organomegaly. Extremities: No edema Psychiatric: alert and oriented x3. Cooperative     ASSESSMENT:  Colon cancer screening   PLAN:  Screening colonoscopy

## 2023-12-08 ENCOUNTER — Telehealth: Payer: Self-pay | Admitting: *Deleted

## 2023-12-08 NOTE — Telephone Encounter (Signed)
 Attempted post procedure follow up call.  No answer - LVM.

## 2023-12-09 ENCOUNTER — Ambulatory Visit: Payer: Self-pay | Admitting: Internal Medicine

## 2023-12-09 LAB — SURGICAL PATHOLOGY

## 2023-12-18 ENCOUNTER — Other Ambulatory Visit: Payer: Self-pay | Admitting: Family Medicine

## 2024-01-03 ENCOUNTER — Telehealth: Payer: Self-pay | Admitting: Pharmacist

## 2024-01-03 NOTE — Telephone Encounter (Signed)
 Submitted a Prior Authorization RENEWAL request to [facility] for TREMFYA  via fax. Will update once we receive a response.  Case # [id number] Phone: [phone] Fax: [phone]  Sherry Pennant, PharmD, MPH, BCPS, CPP Clinical Pharmacist (Rheumatology and Pulmonology)

## 2024-01-24 ENCOUNTER — Other Ambulatory Visit: Payer: Self-pay | Admitting: Physician Assistant

## 2024-01-24 DIAGNOSIS — L400 Psoriasis vulgaris: Secondary | ICD-10-CM

## 2024-01-24 DIAGNOSIS — L4050 Arthropathic psoriasis, unspecified: Secondary | ICD-10-CM

## 2024-01-24 NOTE — Telephone Encounter (Signed)
 Last Fill: 11/23/2023  Labs: 10/06/2023 CBC and CMP WNL   TB Gold: 10/06/2023 TB Gold Negative   Next Visit: 03/07/2024  Last Visit: 10/06/2023  DX: Psoriatic arthropathy   Current Dose per office note 10/06/2023: Tremfya  100 mg sq injections every 8 weeks   Okay to refill Tremfyia?  LMOM for patient to update labs and provided lab hours.

## 2024-02-22 ENCOUNTER — Other Ambulatory Visit: Payer: Self-pay | Admitting: Family Medicine

## 2024-02-22 NOTE — Progress Notes (Deleted)
 Office Visit Note  Patient: Donald Pearson             Date of Birth: March 12, 1976           MRN: [id number]             PCP: Micheal Wolm ORN, MD Referring: Micheal Wolm ORN, MD Visit Date: 03/07/2024 Occupation: Data Unavailable  Subjective:  No chief complaint on file.   History of Present Illness: Donald Pearson is a 48 y.o. male ***     Activities of Daily Living:  Patient reports morning stiffness for *** {minute/hour:19697}.   Patient {ACTIONS;DENIES/REPORTS:21021675::Denies} nocturnal pain.  Difficulty dressing/grooming: {ACTIONS;DENIES/REPORTS:21021675::Denies} Difficulty climbing stairs: {ACTIONS;DENIES/REPORTS:21021675::Denies} Difficulty getting out of chair: {ACTIONS;DENIES/REPORTS:21021675::Denies} Difficulty using hands for taps, buttons, cutlery, and/or writing: {ACTIONS;DENIES/REPORTS:21021675::Denies}  No Rheumatology ROS completed.   PMFS History:  Patient Active Problem List   Diagnosis Date Noted   Essential hypertension 10/17/2023   Psoriatic arthropathy (HCC) 06/28/2018   Family history of psoriasis 06/28/2018   Smoker 06/28/2018   Sacroiliitis 06/28/2018   Plaque psoriasis 12/02/2016   Social anxiety disorder 07/15/2011   History of depression 01/25/2011   Asthma, mild intermittent 01/25/2011    Past Medical History:  Diagnosis Date   Allergy    Anxiety    Asthma    Depression    Hypertension    Psoriatic arthropathy (HCC)    Sleep apnea     Family History  Problem Relation Age of Onset   Diabetes Mother        type ll   Alcohol abuse Mother    Drug abuse Mother    Hypertension Mother    Healthy Brother    Heart disease Maternal Grandmother    Diabetes Maternal Grandmother        type ll   Asthma Daughter    Healthy Son    Asthma Maternal Aunt    Cancer Maternal Aunt    Colon cancer Neg Hx    Colon polyps Neg Hx    Esophageal cancer Neg Hx    Rectal cancer Neg Hx    Stomach cancer Neg Hx    Past Surgical  History:  Procedure Laterality Date   MOLE REMOVAL  2004   VASECTOMY     WISDOM TOOTH EXTRACTION     Social History   Tobacco Use   Smoking status: Every Day    Current packs/day: 0.30    Average packs/day: 0.3 packs/day for 19.8 years (5.9 ttl pk-yrs)    Types: Cigarettes    Start date: 2006    Passive exposure: Never   Smokeless tobacco: Never   Tobacco comments:    smokes 1/4 pack per day 09/15/2020  Vaping Use   Vaping status: Former  Substance Use Topics   Alcohol use: Yes    Comment: 2 daily   Drug use: No   Social History   Social History Narrative   Not on file     Immunization History  Administered Date(s) Administered   Influenza,inj,Quad PF,6+ Mos 03/24/2019, 01/29/2020   Influenza-Unspecified 03/17/2016, 03/08/2017, 02/17/2023   PFIZER(Purple Top)SARS-COV-2 Vaccination 08/02/2019, 08/27/2019   Pneumococcal Polysaccharide-23 05/19/2021   Tdap 06/17/2014     Objective: Vital Signs: There were no vitals taken for this visit.   Physical Exam   Musculoskeletal Exam: ***  CDAI Exam: CDAI Score: -- Patient Global: --; Provider Global: -- Swollen: --; Tender: -- Joint Exam 03/07/2024   No joint exam has been documented for this visit   There is currently  no information documented on the homunculus. Go to the Rheumatology activity and complete the homunculus joint exam.  Investigation: No additional findings.  Imaging: No results found.  Recent Labs: Lab Results  Component Value Date   WBC 7.0 10/06/2023   HGB 15.7 10/06/2023   PLT 186 10/06/2023   NA 140 10/06/2023   K 4.7 10/06/2023   CL 104 10/06/2023   CO2 31 10/06/2023   GLUCOSE 101 (H) 10/06/2023   BUN 13 10/06/2023   CREATININE 0.95 10/06/2023   BILITOT 0.4 10/06/2023   ALKPHOS 69 05/19/2021   AST 20 10/06/2023   ALT 33 10/06/2023   PROT 7.0 10/06/2023   ALBUMIN 4.4 05/19/2021   CALCIUM 9.6 10/06/2023   GFRAA 109 06/16/2018   QFTBGOLDPLUS NEGATIVE 10/06/2023    Speciality  Comments: No specialty comments available.  Procedures:  No procedures performed Allergies: Penicillins   Assessment / Plan:     Visit Diagnoses: Psoriatic arthropathy (HCC)  Plaque psoriasis  High risk medication use  Sacroiliitis  Injury of right knee, sequela  Family history of psoriasis  Social anxiety disorder  History of asthma  History of depression  Smoker  Orders: No orders of the defined types were placed in this encounter.  No orders of the defined types were placed in this encounter.   Face-to-face time spent with patient was *** minutes. Greater than 50% of time was spent in counseling and coordination of care.  Follow-Up Instructions: No follow-ups on file.   Waddell CHRISTELLA Craze, PA-C  Note - This record has been created using Dragon software.  Chart creation errors have been sought, but may not always  have been located. Such creation errors do not reflect on  the standard of medical care.

## 2024-03-07 ENCOUNTER — Ambulatory Visit: Admitting: Physician Assistant

## 2024-03-07 DIAGNOSIS — M461 Sacroiliitis, not elsewhere classified: Secondary | ICD-10-CM

## 2024-03-07 DIAGNOSIS — Z8709 Personal history of other diseases of the respiratory system: Secondary | ICD-10-CM

## 2024-03-07 DIAGNOSIS — F17200 Nicotine dependence, unspecified, uncomplicated: Secondary | ICD-10-CM

## 2024-03-07 DIAGNOSIS — Z8659 Personal history of other mental and behavioral disorders: Secondary | ICD-10-CM

## 2024-03-07 DIAGNOSIS — S8991XS Unspecified injury of right lower leg, sequela: Secondary | ICD-10-CM

## 2024-03-07 DIAGNOSIS — Z840 Family history of diseases of the skin and subcutaneous tissue: Secondary | ICD-10-CM

## 2024-03-07 DIAGNOSIS — F4010 Social phobia, unspecified: Secondary | ICD-10-CM

## 2024-03-07 DIAGNOSIS — Z79899 Other long term (current) drug therapy: Secondary | ICD-10-CM

## 2024-03-07 DIAGNOSIS — L4050 Arthropathic psoriasis, unspecified: Secondary | ICD-10-CM

## 2024-03-07 DIAGNOSIS — L400 Psoriasis vulgaris: Secondary | ICD-10-CM

## 2024-03-13 ENCOUNTER — Telehealth: Payer: Self-pay | Admitting: Pharmacist

## 2024-03-13 ENCOUNTER — Other Ambulatory Visit: Payer: Self-pay

## 2024-03-13 ENCOUNTER — Other Ambulatory Visit (HOSPITAL_COMMUNITY): Payer: Self-pay

## 2024-03-13 ENCOUNTER — Other Ambulatory Visit (HOSPITAL_BASED_OUTPATIENT_CLINIC_OR_DEPARTMENT_OTHER): Payer: Self-pay

## 2024-03-13 DIAGNOSIS — Z79899 Other long term (current) drug therapy: Secondary | ICD-10-CM

## 2024-03-13 DIAGNOSIS — L400 Psoriasis vulgaris: Secondary | ICD-10-CM

## 2024-03-13 DIAGNOSIS — L4050 Arthropathic psoriasis, unspecified: Secondary | ICD-10-CM

## 2024-03-13 NOTE — Telephone Encounter (Signed)
 Patient reached out to [facility] to request filling Tremfya  with us . He is overdue for labs. Apparently did not fill prescription in September due to insurance issues  Submitted a Prior Authorization request to [employer] for TREMFYA  via CoverMyMeds. Will update once we receive a response.  Key: [id number]    Per automated response: No Pre-Auth required. Limited to 30-day supply. Subject to Limitations & Exclusions. Call # on ID card with questions.  Per test claim:   Sherry Pennant, PharmD, MPH, BCPS, CPP Clinical Pharmacist [facility])

## 2024-03-14 ENCOUNTER — Other Ambulatory Visit (HOSPITAL_COMMUNITY): Payer: Self-pay

## 2024-03-14 NOTE — Telephone Encounter (Signed)
 Sample of Tremfya  reserved in fridge (not logged out in binder)  Tremfya  100mg /mL pen Lot # [id number] Expiration: 01/2025  Patient will need updated CBC/CMP tomorrow when he picks up. Standing orders placed  Beckie Viscardi, PharmD, MPH, BCPS, CPP Clinical Pharmacist [facility])

## 2024-03-14 NOTE — Telephone Encounter (Signed)
 Going off of the payment information it appears the copay card pays $2100 towards his copay, however the total covered amount is $2,130 which implies that his insurance has only paid $30 towards this claim. The copay card also states that it has a maximum of $9,450 worth of funds available and the pt's copay is higher than $11k even after the copay card pays it's part. This means that he would still be left with a copay over $2k even IF we were to obtain a debit card from the manufacturer.   Called pt and explained situation. Informed him that we could provide him with a sample to provide him with a 56-day bridge, however during that time we would heavily advise that he consider looking for a new insurance plan. Pt verbalized understanding and appreciation for the recommendation. He states that he will come by the clinic tomorrow (10/30) to pick up sample.

## 2024-03-15 NOTE — Telephone Encounter (Signed)
 Patient stopped by - advised that we will need labs completed prior to giving sample (it has been 5 months since last labs completed). He will stop by tomorrow for labs and sample  Sherry Pennant, PharmD, MPH, BCPS, CPP Clinical Pharmacist [facility])

## 2024-03-19 ENCOUNTER — Other Ambulatory Visit (HOSPITAL_BASED_OUTPATIENT_CLINIC_OR_DEPARTMENT_OTHER): Payer: Self-pay

## 2024-03-19 ENCOUNTER — Other Ambulatory Visit: Payer: Self-pay

## 2024-03-19 MED FILL — Telmisartan Tab 40 MG: ORAL | 30 days supply | Qty: 30 | Fill #0 | Status: AC

## 2024-03-19 NOTE — Telephone Encounter (Signed)
 Closing encounter - pt had change in insurance last month

## 2024-03-20 ENCOUNTER — Other Ambulatory Visit: Payer: Self-pay | Admitting: *Deleted

## 2024-03-20 DIAGNOSIS — L400 Psoriasis vulgaris: Secondary | ICD-10-CM

## 2024-03-20 DIAGNOSIS — Z79899 Other long term (current) drug therapy: Secondary | ICD-10-CM

## 2024-03-20 DIAGNOSIS — L4050 Arthropathic psoriasis, unspecified: Secondary | ICD-10-CM

## 2024-03-20 LAB — CBC WITH DIFFERENTIAL/PLATELET
Absolute Lymphocytes: 1274 {cells}/uL (ref 850–3900)
Absolute Monocytes: 647 {cells}/uL (ref 200–950)
Basophils Absolute: 92 {cells}/uL (ref 0–200)
Basophils Relative: 1.4 %
Eosinophils Absolute: 198 {cells}/uL (ref 15–500)
Eosinophils Relative: 3 %
HCT: 46 % (ref 38.5–50.0)
Hemoglobin: 15.5 g/dL (ref 13.2–17.1)
MCH: 31.5 pg (ref 27.0–33.0)
MCHC: 33.7 g/dL (ref 32.0–36.0)
MCV: 93.5 fL (ref 80.0–100.0)
MPV: 10.3 fL (ref 7.5–12.5)
Monocytes Relative: 9.8 %
Neutro Abs: 4389 {cells}/uL (ref 1500–7800)
Neutrophils Relative %: 66.5 %
Platelets: 193 Thousand/uL (ref 140–400)
RBC: 4.92 Million/uL (ref 4.20–5.80)
RDW: 11.9 % (ref 11.0–15.0)
Total Lymphocyte: 19.3 %
WBC: 6.6 Thousand/uL (ref 3.8–10.8)

## 2024-03-20 LAB — COMPREHENSIVE METABOLIC PANEL WITH GFR
AG Ratio: 2.2 (calc) (ref 1.0–2.5)
ALT: 33 U/L (ref 9–46)
AST: 22 U/L (ref 10–40)
Albumin: 4.8 g/dL (ref 3.6–5.1)
Alkaline phosphatase (APISO): 65 U/L (ref 36–130)
BUN: 11 mg/dL (ref 7–25)
CO2: 29 mmol/L (ref 20–32)
Calcium: 9.6 mg/dL (ref 8.6–10.3)
Chloride: 102 mmol/L (ref 98–110)
Creat: 0.97 mg/dL (ref 0.60–1.29)
Globulin: 2.2 g/dL (ref 1.9–3.7)
Glucose, Bld: 126 mg/dL — ABNORMAL HIGH (ref 65–99)
Potassium: 4.7 mmol/L (ref 3.5–5.3)
Sodium: 139 mmol/L (ref 135–146)
Total Bilirubin: 0.5 mg/dL (ref 0.2–1.2)
Total Protein: 7 g/dL (ref 6.1–8.1)
eGFR: 98 mL/min/1.73m2 (ref >=60)

## 2024-03-20 NOTE — Telephone Encounter (Signed)
 Medication Samples have been provided to the patient.  Drug name: Tremfya        Strength: 100 mg/mL        Qty: 1  LOT: [id number]  Exp.Date: 01/2025  Dosing instructions: Inject one pen into skin every 8 weeks.

## 2024-03-21 ENCOUNTER — Ambulatory Visit: Payer: Self-pay | Admitting: Physician Assistant

## 2024-03-21 NOTE — Progress Notes (Signed)
 Glucose is 126, rest of CMP WNL. CBC WNL

## 2024-03-23 ENCOUNTER — Other Ambulatory Visit: Payer: Self-pay | Admitting: Family Medicine

## 2024-03-23 NOTE — Telephone Encounter (Signed)
 Copied from CRM #[id number]. Topic: Clinical - Medication Refill >> Mar 23, 2024 12:27 PM Deaijah H wrote: Medication: escitalopram  (LEXAPRO ) 10 MG tablet  Has the patient contacted their pharmacy? Yes (Agent: If no, request that the patient contact the pharmacy for the refill. If patient does not wish to contact the pharmacy document the reason why and proceed with request.) [facility]  (Agent: If yes, when and what did the pharmacy advise?)  This is the patient's preferred pharmacy:   [facility] [address] [place] [address] Phone: [phone] Fax: [phone]  Is this the correct pharmacy for this prescription? Yes If no, delete pharmacy and type the correct one.   Has the prescription been filled recently? No  Is the patient out of the medication? Yes  Has the patient been seen for an appointment in the last year OR does the patient have an upcoming appointment? Yes  Can we respond through MyChart? Yes  Agent: Please be advised that Rx refills may take up to 3 business days. We ask that you follow-up with your pharmacy.

## 2024-03-26 ENCOUNTER — Other Ambulatory Visit (HOSPITAL_BASED_OUTPATIENT_CLINIC_OR_DEPARTMENT_OTHER): Payer: Self-pay

## 2024-03-26 MED ORDER — ESCITALOPRAM OXALATE 10 MG PO TABS
10.0000 mg | ORAL_TABLET | Freq: Every day | ORAL | 1 refills | Status: AC
  Filled 2024-03-26: qty 30, 30d supply, fill #0
  Filled 2024-04-20: qty 30, 30d supply, fill #1
  Filled 2024-05-23: qty 30, 30d supply, fill #2
  Filled 2024-06-19: qty 30, 30d supply, fill #3

## 2024-03-27 NOTE — Progress Notes (Unsigned)
 Office Visit Note  Patient: Donald Pearson             Date of Birth: [birth year]           MRN: [id number]             PCP: Micheal Wolm ORN, MD Referring: Micheal Wolm ORN, MD Visit Date: 04/10/2024 Occupation: Data Unavailable  Subjective:  Medication monitoring  History of Present Illness: Donald Pearson is a 48 y.o. male with history of psoriatic arthritis.  Patient remains on  Tremfya  100 mg sq injections every 8 weeks.  Patient is tolerating Tremfya  without any side effects or injection site reactions.  Patient reports that he has noticed a 95% improvement in his psoriasis since initiating Tremfya .  He has also noticed about a 60% improvement in his joint pain and stiffness.  He very rarely needs to take Aleve for symptomatic relief.  He denies any SI joint discomfort at this time.  He denies any nocturnal pain.  He denies any difficulty performing ADLs.  He denies any Achilles tendinitis or plantar fasciitis at this time.  He denies any new medical conditions.  He has not had any recent or recurrent infections.   Activities of Daily Living:  Patient reports morning stiffness for 15 minutes.   Patient Denies nocturnal pain.  Difficulty dressing/grooming: Denies Difficulty climbing stairs: Denies Difficulty getting out of chair: Denies Difficulty using hands for taps, buttons, cutlery, and/or writing: Denies  Review of Systems  Constitutional:  Negative for fatigue.  HENT:  Negative for mouth sores and mouth dryness.   Eyes:  Negative for dryness.  Respiratory:  Negative for shortness of breath.   Cardiovascular:  Negative for chest pain and palpitations.  Gastrointestinal:  Positive for constipation. Negative for blood in stool and diarrhea.  Endocrine: Negative for increased urination.  Genitourinary:  Negative for involuntary urination.  Musculoskeletal:  Positive for joint pain, joint pain, joint swelling, myalgias, morning stiffness and myalgias. Negative for gait  problem, muscle weakness and muscle tenderness.  Skin:  Positive for rash. Negative for color change, hair loss and sensitivity to sunlight.  Allergic/Immunologic: Negative for susceptible to infections.  Neurological:  Negative for dizziness and headaches.  Hematological:  Negative for swollen glands.  Psychiatric/Behavioral:  Positive for depressed mood. Negative for sleep disturbance. The patient is nervous/anxious.     PMFS History:  Patient Active Problem List   Diagnosis Date Noted   Essential hypertension 10/17/2023   Psoriatic arthropathy (HCC) 06/28/2018   Family history of psoriasis 06/28/2018   Smoker 06/28/2018   Sacroiliitis 06/28/2018   Plaque psoriasis 12/02/2016   Social anxiety disorder 07/15/2011   History of depression 01/25/2011   Asthma, mild intermittent 01/25/2011    Past Medical History:  Diagnosis Date   Allergy    Anxiety    Asthma    Depression    Hypertension    Psoriatic arthropathy (HCC)    Sleep apnea     Family History  Problem Relation Age of Onset   Diabetes Mother        type ll   Alcohol abuse Mother    Drug abuse Mother    Hypertension Mother    Healthy Brother    Heart disease Maternal Grandmother    Diabetes Maternal Grandmother        type ll   Asthma Daughter    Healthy Son    Asthma Maternal Aunt    Cancer Maternal Aunt    Colon cancer Neg Hx  Colon polyps Neg Hx    Esophageal cancer Neg Hx    Rectal cancer Neg Hx    Stomach cancer Neg Hx    Past Surgical History:  Procedure Laterality Date   MOLE REMOVAL  2004   VASECTOMY     WISDOM TOOTH EXTRACTION     Social History   Tobacco Use   Smoking status: Every Day    Current packs/day: 0.30    Average packs/day: 0.3 packs/day for 19.9 years (6.0 ttl pk-yrs)    Types: Cigarettes    Start date: 2006    Passive exposure: Never   Smokeless tobacco: Never   Tobacco comments:    smokes 1/4 pack per day 09/15/2020  Vaping Use   Vaping status: Former  Substance Use  Topics   Alcohol use: Yes    Comment: 2 daily   Drug use: No   Social History   Social History Narrative   Not on file     Immunization History  Administered Date(s) Administered   Influenza,inj,Quad PF,6+ Mos 03/24/2019, 01/29/2020   Influenza-Unspecified 03/17/2016, 03/08/2017, 02/17/2023   PFIZER(Purple Top)SARS-COV-2 Vaccination 08/02/2019, 08/27/2019   Pneumococcal Polysaccharide-23 05/19/2021   Tdap 06/17/2014     Objective: Vital Signs: BP 127/83   Pulse 79   Temp 98.1 F (36.7 C)   Resp 14   Ht 5' 10 (1.778 m)   Wt 265 lb 3.2 oz (120.3 kg)   BMI 38.05 kg/m    Physical Exam Vitals and nursing note reviewed.  Constitutional:      Appearance: He is well-developed.  HENT:     Head: Normocephalic and atraumatic.  Eyes:     Conjunctiva/sclera: Conjunctivae normal.     Pupils: Pupils are equal, round, and reactive to light.  Cardiovascular:     Rate and Rhythm: Normal rate and regular rhythm.     Heart sounds: Normal heart sounds.  Pulmonary:     Effort: Pulmonary effort is normal.     Breath sounds: Normal breath sounds.  Abdominal:     General: Bowel sounds are normal.     Palpations: Abdomen is soft.  Musculoskeletal:     Cervical back: Normal range of motion and neck supple.  Skin:    General: Skin is warm and dry.     Capillary Refill: Capillary refill takes less than 2 seconds.     Comments: 1 plaque of psoriasis on the left lower leg  Neurological:     Mental Status: He is alert and oriented to person, place, and time.  Psychiatric:        Behavior: Behavior normal.      Musculoskeletal Exam: C-spine, thoracic spine, lumbar spine have good range of motion.  No midline spinal tenderness.  No SI joint tenderness.  Shoulder joints, elbow joints, wrist joints, MCPs, PIPs, DIPs have good range of motion with no synovitis.  Complete fist formation bilaterally.  Hip joints have good range of motion with no groin pain.  Knee joints have good range of  motion no warmth or effusion.  Ankle joints have good range of motion no tenderness or joint swelling.  No evidence of Achilles tendinitis or plantar fasciitis.   CDAI Exam: CDAI Score: -- Patient Global: --; Provider Global: -- Swollen: --; Tender: -- Joint Exam 04/10/2024   No joint exam has been documented for this visit   There is currently no information documented on the homunculus. Go to the Rheumatology activity and complete the homunculus joint exam.  Investigation: No additional  findings.  Imaging: No results found.  Recent Labs: Lab Results  Component Value Date   WBC 6.6 03/20/2024   HGB 15.5 03/20/2024   PLT 193 03/20/2024   NA 139 03/20/2024   K 4.7 03/20/2024   CL 102 03/20/2024   CO2 29 03/20/2024   GLUCOSE 126 (H) 03/20/2024   BUN 11 03/20/2024   CREATININE 0.97 03/20/2024   BILITOT 0.5 03/20/2024   ALKPHOS 69 05/19/2021   AST 22 03/20/2024   ALT 33 03/20/2024   PROT 7.0 03/20/2024   ALBUMIN 4.4 05/19/2021   CALCIUM 9.6 03/20/2024   GFRAA 109 06/16/2018   QFTBGOLDPLUS NEGATIVE 10/06/2023    Speciality Comments: No specialty comments available.  Procedures:  No procedures performed Allergies: Penicillins   Assessment / Plan:     Visit Diagnoses: Psoriatic arthropathy (HCC): He has no synovitis or dactylitis on examination today.  No midline spinal tenderness.  No SI joint tenderness.  No evidence of Achilles tendinitis or plantar fasciitis.  He has noticed about a 60% improvement in his joint pain and inflammation since initiating Tremfya .  He has noticed a 95% improvement in his psoriasis since initiating Tremfya .  He has been tolerating Tremfya  without any side effects or injection site reactions.  No medication changes were made at this time.  He was advised to notify us  if he develops any signs or symptoms of a flare.  He will follow-up in the office in 5 months or sooner if needed.  Plaque psoriasis: 95% improvement since starting tremfya .  1  small patch on left LE.  May benefit from the use of Vtama--plan to consider sending in once he has new insurance in 2026.   High risk medication use - Tremfya  100 mg sq injections every 8 weeks.  Tremfya  initiated on 10/27/2022.  Inadequate response to Otezla  30 mg 1 tablet BID (started in February 2020). CBC and CMP updated on 03/20/24.  His next lab work will be due in February and every 3 months.  TB gold negative on 10/06/23.  No recent or recurrent infections. Discussed the importance of holding tremfya  if he develops signs or symptoms of an infection and to resume once the infection has completely cleared.   Sacroiliitis - X-rays were unremarkable on 07/28/20.  He has no SI joint tenderness upon palpation.  No nocturnal pain.  Other medical conditions are listed as follows:   Injury of right knee, sequela: Intermittent discomfort and clicking. No effusion noted.   Family history of psoriasis  Social anxiety disorder  History of asthma  History of depression  Smoker  Orders: No orders of the defined types were placed in this encounter.  No orders of the defined types were placed in this encounter.    Follow-Up Instructions: Return in about 5 months (around 09/08/2024) for Psoriatic arthritis.   Waddell CHRISTELLA Craze, PA-C  Note - This record has been created using Dragon software.  Chart creation errors have been sought, but may not always  have been located. Such creation errors do not reflect on  the standard of medical care.

## 2024-04-10 ENCOUNTER — Ambulatory Visit: Payer: Self-pay | Attending: Physician Assistant | Admitting: Physician Assistant

## 2024-04-10 ENCOUNTER — Encounter: Payer: Self-pay | Admitting: Physician Assistant

## 2024-04-10 VITALS — BP 127/83 | HR 79 | Temp 98.1°F | Resp 14 | Ht 70.0 in | Wt 265.2 lb

## 2024-04-10 DIAGNOSIS — Z79899 Other long term (current) drug therapy: Secondary | ICD-10-CM | POA: Diagnosis not present

## 2024-04-10 DIAGNOSIS — M461 Sacroiliitis, not elsewhere classified: Secondary | ICD-10-CM | POA: Diagnosis not present

## 2024-04-10 DIAGNOSIS — L400 Psoriasis vulgaris: Secondary | ICD-10-CM | POA: Diagnosis not present

## 2024-04-10 DIAGNOSIS — Z8709 Personal history of other diseases of the respiratory system: Secondary | ICD-10-CM

## 2024-04-10 DIAGNOSIS — Z8659 Personal history of other mental and behavioral disorders: Secondary | ICD-10-CM

## 2024-04-10 DIAGNOSIS — L4050 Arthropathic psoriasis, unspecified: Secondary | ICD-10-CM | POA: Diagnosis not present

## 2024-04-10 DIAGNOSIS — S8991XS Unspecified injury of right lower leg, sequela: Secondary | ICD-10-CM

## 2024-04-10 DIAGNOSIS — Z840 Family history of diseases of the skin and subcutaneous tissue: Secondary | ICD-10-CM

## 2024-04-10 DIAGNOSIS — F4010 Social phobia, unspecified: Secondary | ICD-10-CM

## 2024-04-10 DIAGNOSIS — F17200 Nicotine dependence, unspecified, uncomplicated: Secondary | ICD-10-CM

## 2024-04-10 NOTE — Patient Instructions (Signed)
 Standing Labs We placed an order today for your standing lab work.   Please have your standing labs drawn in February and every 3 months  Please have your labs drawn 2 weeks prior to your appointment so that the provider can discuss your lab results at your appointment, if possible.  Please note that you may see your imaging and lab results in MyChart before we have reviewed them. We will contact you once all results are reviewed. Please allow our office up to 72 hours to thoroughly review all of the results before contacting the office for clarification of your results.  WALK-IN LAB HOURS  Monday through Thursday from 8:00 am - 4:30 pm and Friday from 8:00 am-12:00 pm.  Patients with office visits requiring labs will be seen before walk-in labs.  You may encounter longer than normal wait times. Please allow additional time. Wait times may be shorter on  Monday and Thursday afternoons.  We do not book appointments for walk-in labs. We appreciate your patience and understanding with our staff.   Labs are drawn by [facility]. Please bring your co-pay at the time of your lab draw.  You may receive a bill from [facility] for your lab work.  Please note if you are on Hydroxychloroquine  and and an order has been placed for a Hydroxychloroquine  level,  you will need to have it drawn 4 hours or more after your last dose.  If you wish to have your labs drawn at another location, please call the office 24 hours in advance so we can fax the orders.  The office is located at [address], [address], [place], [place] [address]   If you have any questions regarding directions or hours of operation,  please call [phone].   As a reminder, please drink plenty of water prior to coming for your lab work. Thanks!

## 2024-04-18 ENCOUNTER — Ambulatory Visit

## 2024-04-18 ENCOUNTER — Other Ambulatory Visit: Payer: Self-pay | Admitting: Family Medicine

## 2024-04-18 ENCOUNTER — Other Ambulatory Visit

## 2024-04-18 DIAGNOSIS — Z111 Encounter for screening for respiratory tuberculosis: Secondary | ICD-10-CM

## 2024-04-20 ENCOUNTER — Other Ambulatory Visit (HOSPITAL_COMMUNITY): Payer: Self-pay

## 2024-04-21 ENCOUNTER — Ambulatory Visit: Payer: Self-pay | Admitting: Family Medicine

## 2024-04-21 LAB — QUANTIFERON-TB GOLD PLUS
Mitogen-NIL: 7.43 [IU]/mL
NIL: 0.02 [IU]/mL
QuantiFERON-TB Gold Plus: NEGATIVE
TB1-NIL: 0 [IU]/mL
TB2-NIL: 0 [IU]/mL

## 2024-05-18 ENCOUNTER — Other Ambulatory Visit (HOSPITAL_COMMUNITY): Payer: Self-pay

## 2024-05-18 ENCOUNTER — Telehealth: Payer: Self-pay

## 2024-05-18 NOTE — Telephone Encounter (Signed)
 Patient appears to have two active insurances now:    ID # [id number]  Submitted a Prior Authorization request to [employer] for TREMFYA  via CoverMyMeds. Will update once we receive a response.  Key: [id number]    Per automated response: No Pre-Auth required. Limited to 30-day supply. Subject to Limitations & Exclusions. Call # on ID card with questions.   ID # [id number]  Submitted a Prior Authorization request to [employer] for TREMFYA  via CoverMyMeds. Will update once we receive a response.  Key: [id number]

## 2024-05-23 ENCOUNTER — Other Ambulatory Visit: Payer: Self-pay | Admitting: *Deleted

## 2024-05-23 ENCOUNTER — Other Ambulatory Visit (HOSPITAL_COMMUNITY): Payer: Self-pay

## 2024-05-23 ENCOUNTER — Other Ambulatory Visit: Payer: Self-pay

## 2024-05-23 MED ORDER — TREMFYA PEN 100 MG/ML ~~LOC~~ SOAJ: 100.0000 mg | SUBCUTANEOUS | 0 refills | Status: AC

## 2024-05-23 NOTE — Telephone Encounter (Signed)
 Received notification from [employer] regarding a prior authorization for TREMFYA . Authorization has been APPROVED from 05/18/2024 to 05/18/2025. Approval letter sent to scan center.  Patient must fill through [facility]: [phone]  (member iD [id number])  Authorization # [id number]  Rx sent to [facility] today. MyChart message sent to patient. Of note, alternate [employer] plan appears to leave patient with [phone] copay with [facility] e-voucher.   Sherry Pennant, PharmD, MPH, BCPS, CPP Clinical Pharmacist

## 2024-05-23 NOTE — Telephone Encounter (Signed)
 Patient contacted the office and requested a refill on Tremfya    Last Fill: 01/24/2024  Labs: 03/20/2024 Glucose is 126, rest of CMP WNL. CBC WNL  TB Gold: 04/18/2024 Neg    Next Visit: 09/11/2024  Last Visit: 04/10/2024  IK:Ednmpjupr arthropathy   Current Dose per office note 04/10/2024: Tremfya  100 mg sq injections every 8 weeks.   Okay to refill Tremfyia?

## 2024-05-25 ENCOUNTER — Telehealth: Payer: Self-pay

## 2024-05-25 NOTE — Telephone Encounter (Signed)
 Received a fax for clarification of the patient's Tremfya . Contacted [employer] and advised the prescription should be for 2 mL and 0 refills. Pharmacist verbalized understanding.

## 2024-06-19 ENCOUNTER — Other Ambulatory Visit: Payer: Self-pay

## 2024-06-19 ENCOUNTER — Other Ambulatory Visit (HOSPITAL_COMMUNITY): Payer: Self-pay

## 2024-06-19 ENCOUNTER — Encounter: Payer: Self-pay | Admitting: Pharmacist

## 2024-06-19 MED FILL — Telmisartan Tab 40 MG: ORAL | 30 days supply | Qty: 30 | Fill #1 | Status: AC

## 2024-06-20 ENCOUNTER — Other Ambulatory Visit: Payer: Self-pay

## 2024-09-11 ENCOUNTER — Ambulatory Visit: Admitting: Physician Assistant
